# Patient Record
Sex: Male | Born: 1955 | Race: White | Hispanic: No | Marital: Married | Smoking: Former smoker
Health system: Southern US, Community
[De-identification: ages and names within clinical notes are randomized; demographics above are authoritative.]

## PROBLEM LIST (undated history)

## (undated) DIAGNOSIS — J449 Chronic obstructive pulmonary disease, unspecified: Secondary | ICD-10-CM

## (undated) DIAGNOSIS — C3492 Malignant neoplasm of unspecified part of left bronchus or lung: Secondary | ICD-10-CM

## (undated) DIAGNOSIS — E785 Hyperlipidemia, unspecified: Secondary | ICD-10-CM

## (undated) DIAGNOSIS — J42 Unspecified chronic bronchitis: Secondary | ICD-10-CM

## (undated) DIAGNOSIS — K921 Melena: Secondary | ICD-10-CM

## (undated) DIAGNOSIS — M62830 Muscle spasm of back: Secondary | ICD-10-CM

## (undated) DIAGNOSIS — F419 Anxiety disorder, unspecified: Secondary | ICD-10-CM

## (undated) DIAGNOSIS — G894 Chronic pain syndrome: Secondary | ICD-10-CM

## (undated) DIAGNOSIS — M1990 Unspecified osteoarthritis, unspecified site: Secondary | ICD-10-CM

## (undated) DIAGNOSIS — K219 Gastro-esophageal reflux disease without esophagitis: Secondary | ICD-10-CM

## (undated) DIAGNOSIS — I10 Essential (primary) hypertension: Secondary | ICD-10-CM

## (undated) DIAGNOSIS — Z9089 Acquired absence of other organs: Secondary | ICD-10-CM

## (undated) HISTORY — DX: Gastro-esophageal reflux disease without esophagitis: K21.9

## (undated) HISTORY — DX: Muscle spasm of back: M62.830

## (undated) HISTORY — DX: Malignant neoplasm of unspecified part of left bronchus or lung: C34.92

## (undated) HISTORY — PX: TONSILLECTOMY: SUR1361

## (undated) HISTORY — DX: Chronic pain syndrome: G89.4

## (undated) HISTORY — PX: OTHER SURGICAL HISTORY: SHX169

## (undated) HISTORY — DX: Chronic obstructive pulmonary disease, unspecified: J44.9

## (undated) HISTORY — DX: Acquired absence of other organs: Z90.89

## (undated) HISTORY — DX: Melena: K92.1

## (undated) HISTORY — DX: Unspecified osteoarthritis, unspecified site: M19.90

## (undated) HISTORY — DX: Essential (primary) hypertension: I10

## (undated) HISTORY — DX: Unspecified chronic bronchitis: J42

## (undated) HISTORY — DX: Anxiety disorder, unspecified: F41.9

## (undated) HISTORY — DX: Hyperlipidemia, unspecified: E78.5

---

## 1988-08-09 HISTORY — PX: INGUINAL HERNIA REPAIR: SUR1180

## 1990-08-09 HISTORY — PX: BACK SURGERY: SHX140

## 1998-09-15 ENCOUNTER — Other Ambulatory Visit: Admission: RE | Admit: 1998-09-15 | Discharge: 1998-09-15 | Payer: Self-pay | Admitting: Gastroenterology

## 2001-01-20 ENCOUNTER — Encounter: Payer: Self-pay | Admitting: Surgery

## 2001-01-20 ENCOUNTER — Encounter: Admission: RE | Admit: 2001-01-20 | Discharge: 2001-01-20 | Payer: Self-pay | Admitting: Surgery

## 2001-06-29 ENCOUNTER — Encounter: Admission: RE | Admit: 2001-06-29 | Discharge: 2001-06-29 | Payer: Self-pay | Admitting: Surgery

## 2001-06-29 ENCOUNTER — Encounter: Payer: Self-pay | Admitting: Surgery

## 2001-08-21 ENCOUNTER — Encounter: Payer: Self-pay | Admitting: Gastroenterology

## 2003-01-08 ENCOUNTER — Emergency Department (HOSPITAL_COMMUNITY): Admission: EM | Admit: 2003-01-08 | Discharge: 2003-01-08 | Payer: Self-pay

## 2003-01-17 ENCOUNTER — Encounter: Payer: Self-pay | Admitting: Cardiology

## 2003-01-17 ENCOUNTER — Ambulatory Visit (HOSPITAL_COMMUNITY): Admission: RE | Admit: 2003-01-17 | Discharge: 2003-01-17 | Payer: Self-pay | Admitting: Cardiology

## 2004-12-24 ENCOUNTER — Ambulatory Visit: Payer: Self-pay | Admitting: Family Medicine

## 2005-04-06 ENCOUNTER — Ambulatory Visit: Payer: Self-pay | Admitting: Family Medicine

## 2005-04-08 ENCOUNTER — Ambulatory Visit (HOSPITAL_COMMUNITY): Admission: RE | Admit: 2005-04-08 | Discharge: 2005-04-09 | Payer: Self-pay | Admitting: Orthopaedic Surgery

## 2005-06-22 ENCOUNTER — Ambulatory Visit (HOSPITAL_COMMUNITY): Admission: RE | Admit: 2005-06-22 | Discharge: 2005-06-22 | Payer: Self-pay | Admitting: Orthopedic Surgery

## 2005-11-30 ENCOUNTER — Ambulatory Visit: Payer: Self-pay | Admitting: Family Medicine

## 2006-04-27 ENCOUNTER — Ambulatory Visit: Payer: Self-pay | Admitting: Family Medicine

## 2006-07-14 ENCOUNTER — Ambulatory Visit: Payer: Self-pay | Admitting: Family Medicine

## 2006-11-08 ENCOUNTER — Ambulatory Visit: Payer: Self-pay | Admitting: Family Medicine

## 2006-12-06 ENCOUNTER — Ambulatory Visit: Payer: Self-pay | Admitting: Family Medicine

## 2007-01-16 ENCOUNTER — Ambulatory Visit: Payer: Self-pay | Admitting: Pulmonary Disease

## 2007-03-28 ENCOUNTER — Telehealth: Payer: Self-pay | Admitting: Family Medicine

## 2007-03-29 ENCOUNTER — Telehealth: Payer: Self-pay | Admitting: Family Medicine

## 2007-05-02 DIAGNOSIS — K219 Gastro-esophageal reflux disease without esophagitis: Secondary | ICD-10-CM

## 2007-05-02 DIAGNOSIS — J4489 Other specified chronic obstructive pulmonary disease: Secondary | ICD-10-CM | POA: Insufficient documentation

## 2007-05-02 DIAGNOSIS — E785 Hyperlipidemia, unspecified: Secondary | ICD-10-CM | POA: Insufficient documentation

## 2007-05-02 DIAGNOSIS — J449 Chronic obstructive pulmonary disease, unspecified: Secondary | ICD-10-CM

## 2007-05-23 ENCOUNTER — Telehealth: Payer: Self-pay | Admitting: Family Medicine

## 2007-05-24 ENCOUNTER — Telehealth: Payer: Self-pay | Admitting: Family Medicine

## 2007-05-25 ENCOUNTER — Encounter: Payer: Self-pay | Admitting: Family Medicine

## 2007-05-31 ENCOUNTER — Telehealth: Payer: Self-pay | Admitting: Family Medicine

## 2007-06-22 DIAGNOSIS — Z9089 Acquired absence of other organs: Secondary | ICD-10-CM

## 2007-06-23 ENCOUNTER — Telehealth (INDEPENDENT_AMBULATORY_CARE_PROVIDER_SITE_OTHER): Payer: Self-pay | Admitting: *Deleted

## 2007-06-23 ENCOUNTER — Telehealth: Payer: Self-pay

## 2007-06-26 ENCOUNTER — Telehealth: Payer: Self-pay | Admitting: Family Medicine

## 2007-06-26 ENCOUNTER — Telehealth: Payer: Self-pay

## 2007-07-04 ENCOUNTER — Telehealth: Payer: Self-pay | Admitting: Family Medicine

## 2007-08-22 ENCOUNTER — Telehealth: Payer: Self-pay | Admitting: Family Medicine

## 2007-10-05 ENCOUNTER — Telehealth: Payer: Self-pay | Admitting: Family Medicine

## 2007-10-10 ENCOUNTER — Ambulatory Visit: Payer: Self-pay | Admitting: Family Medicine

## 2007-10-10 DIAGNOSIS — G894 Chronic pain syndrome: Secondary | ICD-10-CM | POA: Insufficient documentation

## 2007-11-06 ENCOUNTER — Telehealth: Payer: Self-pay | Admitting: Family Medicine

## 2007-12-05 ENCOUNTER — Telehealth: Payer: Self-pay | Admitting: Family Medicine

## 2008-01-31 ENCOUNTER — Telehealth: Payer: Self-pay | Admitting: Family Medicine

## 2008-02-13 ENCOUNTER — Ambulatory Visit: Payer: Self-pay | Admitting: Family Medicine

## 2008-02-13 DIAGNOSIS — M129 Arthropathy, unspecified: Secondary | ICD-10-CM | POA: Insufficient documentation

## 2008-02-13 DIAGNOSIS — F411 Generalized anxiety disorder: Secondary | ICD-10-CM | POA: Insufficient documentation

## 2008-02-29 ENCOUNTER — Ambulatory Visit: Payer: Self-pay | Admitting: Family Medicine

## 2008-02-29 LAB — CONVERTED CEMR LAB
Ketones, urine, test strip: NEGATIVE
Protein, U semiquant: NEGATIVE
Urobilinogen, UA: 0.2
WBC Urine, dipstick: NEGATIVE

## 2008-03-07 ENCOUNTER — Ambulatory Visit: Payer: Self-pay | Admitting: Family Medicine

## 2008-03-07 DIAGNOSIS — M5380 Other specified dorsopathies, site unspecified: Secondary | ICD-10-CM | POA: Insufficient documentation

## 2008-03-07 LAB — CONVERTED CEMR LAB
ALT: 15 units/L (ref 0–53)
CO2: 29 meq/L (ref 19–32)
Calcium: 9.1 mg/dL (ref 8.4–10.5)
Chloride: 102 meq/L (ref 96–112)
Creatinine, Ser: 0.7 mg/dL (ref 0.4–1.5)
Eosinophils Absolute: 0.3 10*3/uL (ref 0.0–0.7)
Eosinophils Relative: 3.2 % (ref 0.0–5.0)
GFR calc non Af Amer: 125 mL/min
Hemoglobin: 16.4 g/dL (ref 13.0–17.0)
MCHC: 34.7 g/dL (ref 30.0–36.0)
MCV: 92.5 fL (ref 78.0–100.0)
Neutro Abs: 5.4 10*3/uL (ref 1.4–7.7)
Neutrophils Relative %: 58.1 % (ref 43.0–77.0)
PSA: 0.71 ng/mL (ref 0.10–4.00)
Platelets: 298 10*3/uL (ref 150–400)
Potassium: 3.8 meq/L (ref 3.5–5.1)
RBC: 5.1 M/uL (ref 4.22–5.81)
WBC: 9.3 10*3/uL (ref 4.5–10.5)

## 2008-03-11 ENCOUNTER — Telehealth: Payer: Self-pay | Admitting: Family Medicine

## 2008-03-13 ENCOUNTER — Ambulatory Visit: Payer: Self-pay | Admitting: Family Medicine

## 2008-03-13 LAB — CONVERTED CEMR LAB
OCCULT 2: NEGATIVE
OCCULT 3: NEGATIVE

## 2008-03-15 ENCOUNTER — Telehealth: Payer: Self-pay | Admitting: Family Medicine

## 2008-03-26 ENCOUNTER — Encounter: Payer: Self-pay | Admitting: Family Medicine

## 2008-03-28 ENCOUNTER — Telehealth: Payer: Self-pay | Admitting: Family Medicine

## 2008-04-09 ENCOUNTER — Telehealth: Payer: Self-pay | Admitting: Family Medicine

## 2008-04-30 ENCOUNTER — Ambulatory Visit: Payer: Self-pay | Admitting: Gastroenterology

## 2008-05-15 ENCOUNTER — Telehealth: Payer: Self-pay | Admitting: Family Medicine

## 2008-05-28 ENCOUNTER — Ambulatory Visit: Payer: Self-pay | Admitting: Gastroenterology

## 2008-05-28 DIAGNOSIS — Z860100 Personal history of colon polyps, unspecified: Secondary | ICD-10-CM | POA: Insufficient documentation

## 2008-05-28 DIAGNOSIS — K5900 Constipation, unspecified: Secondary | ICD-10-CM | POA: Insufficient documentation

## 2008-06-03 ENCOUNTER — Ambulatory Visit: Payer: Self-pay | Admitting: Gastroenterology

## 2008-06-03 ENCOUNTER — Encounter: Payer: Self-pay | Admitting: Gastroenterology

## 2008-06-04 ENCOUNTER — Encounter: Payer: Self-pay | Admitting: Gastroenterology

## 2008-06-10 ENCOUNTER — Telehealth: Payer: Self-pay | Admitting: Family Medicine

## 2008-06-13 ENCOUNTER — Ambulatory Visit: Payer: Self-pay | Admitting: Family Medicine

## 2008-06-27 LAB — CONVERTED CEMR LAB
ALT: 19 units/L (ref 0–53)
Albumin: 3.2 g/dL — ABNORMAL LOW (ref 3.5–5.2)
HDL: 42 mg/dL (ref >39.0)
LDL Cholesterol: 88 mg/dL (ref 0–99)
Total Bilirubin: 0.5 mg/dL (ref 0.3–1.2)
Total CHOL/HDL Ratio: 3.6
VLDL: 22 mg/dL (ref 0–40)

## 2008-08-05 ENCOUNTER — Telehealth: Payer: Self-pay | Admitting: Family Medicine

## 2008-08-06 ENCOUNTER — Ambulatory Visit: Payer: Self-pay | Admitting: Family Medicine

## 2008-08-06 DIAGNOSIS — M67919 Unspecified disorder of synovium and tendon, unspecified shoulder: Secondary | ICD-10-CM | POA: Insufficient documentation

## 2008-08-06 DIAGNOSIS — M719 Bursopathy, unspecified: Secondary | ICD-10-CM

## 2008-08-06 DIAGNOSIS — F1920 Other psychoactive substance dependence, uncomplicated: Secondary | ICD-10-CM | POA: Insufficient documentation

## 2008-10-01 ENCOUNTER — Telehealth: Payer: Self-pay | Admitting: Family Medicine

## 2008-10-07 ENCOUNTER — Telehealth: Payer: Self-pay | Admitting: Family Medicine

## 2009-01-27 ENCOUNTER — Telehealth: Payer: Self-pay | Admitting: Family Medicine

## 2009-01-28 ENCOUNTER — Emergency Department (HOSPITAL_COMMUNITY): Admission: EM | Admit: 2009-01-28 | Discharge: 2009-01-28 | Payer: Self-pay | Admitting: Emergency Medicine

## 2009-03-07 ENCOUNTER — Telehealth (INDEPENDENT_AMBULATORY_CARE_PROVIDER_SITE_OTHER): Payer: Self-pay | Admitting: *Deleted

## 2009-06-10 ENCOUNTER — Telehealth: Payer: Self-pay | Admitting: Family Medicine

## 2009-06-12 ENCOUNTER — Ambulatory Visit: Payer: Self-pay | Admitting: Family Medicine

## 2009-08-18 ENCOUNTER — Telehealth: Payer: Self-pay | Admitting: Family Medicine

## 2009-08-25 ENCOUNTER — Telehealth: Payer: Self-pay | Admitting: Family Medicine

## 2009-09-02 ENCOUNTER — Encounter: Payer: Self-pay | Admitting: Family Medicine

## 2010-01-27 ENCOUNTER — Telehealth: Payer: Self-pay | Admitting: Family Medicine

## 2010-02-11 ENCOUNTER — Ambulatory Visit: Payer: Self-pay | Admitting: Family Medicine

## 2010-02-11 DIAGNOSIS — F1921 Other psychoactive substance dependence, in remission: Secondary | ICD-10-CM | POA: Insufficient documentation

## 2010-02-11 DIAGNOSIS — D649 Anemia, unspecified: Secondary | ICD-10-CM | POA: Insufficient documentation

## 2010-02-11 DIAGNOSIS — Z87898 Personal history of other specified conditions: Secondary | ICD-10-CM | POA: Insufficient documentation

## 2010-02-17 LAB — CONVERTED CEMR LAB
Albumin: 4.5 g/dL (ref 3.5–5.2)
Alkaline Phosphatase: 68 units/L (ref 39–117)
Basophils Absolute: 0 10*3/uL (ref 0.0–0.1)
Bilirubin, Direct: 0.1 mg/dL (ref 0.0–0.3)
Chloride: 105 meq/L (ref 96–112)
Eosinophils Absolute: 0.2 10*3/uL (ref 0.0–0.7)
Eosinophils Relative: 1.6 % (ref 0.0–5.0)
Lymphs Abs: 2.7 10*3/uL (ref 0.7–4.0)
MCHC: 34.3 g/dL (ref 30.0–36.0)
Neutrophils Relative %: 61.3 % (ref 43.0–77.0)
RDW: 14 % (ref 11.5–14.6)
Sodium: 142 meq/L (ref 135–145)
Total CHOL/HDL Ratio: 4
Triglycerides: 120 mg/dL (ref 0.0–149.0)
VLDL: 24 mg/dL (ref 0.0–40.0)

## 2010-08-18 ENCOUNTER — Telehealth: Payer: Self-pay | Admitting: Family Medicine

## 2010-08-30 ENCOUNTER — Encounter: Payer: Self-pay | Admitting: Internal Medicine

## 2010-09-10 NOTE — Progress Notes (Signed)
Summary: Pt no longer uses [facility]. [facility]  Phone Note Call from Patient Call back at [facility] Phone [phone]   Caller: Patient Summary of Call: Pt is no longer getting meds via [facility]. Pt is wanting [facility] [place] [place] Alprazolam, Symbicort, Combivent.  Pt is ok on meds right now, but will need these called in next.  Initial call taken by: Lucy Antigua,  August 18, 2009 2:50 PM  Follow-up for Phone Call        he is due cpx and needs to get that sch and all meds will be refililled at time of cpx. also needs cpx labs  Follow-up by: Pura Spice, RN,  August 19, 2009 10:03 AM  Additional Follow-up for Phone Call Additional follow up Details #1::        I called pt back to sch cpx and labs, and he says that he has refills remaining, but he just needs them swithched over to [facility] in [place], [place]. Pt says that he was just in to see Dr. Scotty Court a few months ago.  Additional Follow-up by: Lucy Antigua,  August 20, 2009 3:18 PM    Additional Follow-up for Phone Call Additional follow up Details #2::    called [facility] and spoke to [facility] pharmacy tech and she said pt does have refills on meds and last time filled for alprazolam was Jun 21 2009 with 90 day supply. stated [facility] and ask that his meds be transferred.  Follow-up by: Pura Spice, RN,  August 20, 2009 3:52 PM  Additional Follow-up for Phone Call Additional follow up Details #3:: Details for Additional Follow-up Action Taken: I  called pt and told him that he would need to [facility] and have [facility] contact [facility] in order to get meds transferred, and that there were no refills remaining, as noted above.  Additional Follow-up by: Lucy Antigua,  August 21, 2009 2:55 PM

## 2010-09-10 NOTE — Assessment & Plan Note (Signed)
Summary: MED CK / REFILL // RS   Vital Signs:  Patient profile:   55 year old male Height:      72 inches Weight:      217 pounds BMI:     29.54 Temp:     98.6 degrees F Pulse rate:   87 / minute Pulse rhythm:   regular BP sitting:   140 / 90  (left arm)  Vitals Entered By: Pura Spice, RN (February 11, 2010 2:19 PM) CC: refills    History of Present Illness: This 55 year old L. is in for refill of medications and a general physical Continues be treated by a pain clinic with Suboxone for hydrocodone addiction and has been doing her well was unable to get all for the medication Does not use analgesics but continues to need alprazolam for anxiety depression He is employed at [employer]. on [employer] to smoke and uses Symbicort and Combivent, lovastatin for hyperlipidemia : Exam was less than 3 years ago Blood pressure controllled Will get lab studies today  Allergies: 1)  ! Codeine  Past History:  Past Medical History: Last updated: 04/29/2008 Current Problems:  MUSCLE SPASM, BACK (ICD-724.8) PHYSICAL EXAMINATION (ICD-V70.0) BLOOD IN STOOL (ICD-578.1) ANXIETY (ICD-300.00) ARTHRITIS (ICD-716.90) CHRONIC PAIN SYNDROME (ICD-338.4) TONSILLECTOMY, HX OF (ICD-V45.79) BRONCHITIS, CHRONIC (ICD-491.9) HYPERTENSION (ICD-401.9) HYPERLIPIDEMIA (ICD-272.4) GERD (ICD-530.81) COPD (ICD-496)  Past Surgical History: Last updated: 04/29/2008 Tonsillectomy hernia inguinal 1990 back surgery 1992 abdominal surgery to remove adhesions from colon (teenager)  Social History: Last updated: 05/28/2008 Patient currently smokes.  Married has two children Occupation: Radio broadcast assistant Alcohol Use - no Daily Caffeine Use  Risk Factors: Smoking Status: current (04/29/2008) Packs/Day: <1 1/2 (05/02/2007)  Review of Systems      See HPI  The patient denies anorexia, fever, weight loss, weight gain, vision loss, decreased hearing, hoarseness, chest pain, syncope, dyspnea on  exertion, peripheral edema, prolonged cough, headaches, hemoptysis, abdominal pain, melena, hematochezia, severe indigestion/heartburn, hematuria, incontinence, genital sores, muscle weakness, suspicious skin lesions, transient blindness, difficulty walking, depression, unusual weight change, abnormal bleeding, enlarged lymph nodes, angioedema, breast masses, and testicular masses.    Physical Exam  General:  Well-developed,well-nourished,in no acute distress; alert,appropriate and cooperative throughout examination Head:  Normocephalic and atraumatic without obvious abnormalities. No apparent alopecia or balding. Eyes:  No corneal or conjunctival inflammation noted. EOMI. Perrla. Funduscopic exam benign, without hemorrhages, exudates or papilledema. Vision grossly normal. Ears:  External ear exam shows no significant lesions or deformities.  Otoscopic examination reveals clear canals, tympanic membranes are intact bilaterally without bulging, retraction, inflammation or discharge. Hearing is grossly normal bilaterally. Nose:  External nasal examination shows no deformity or inflammation. Nasal mucosa are pink and moist without lesions or exudates. Mouth:  Oral mucosa and oropharynx without lesions or exudates.  Teeth in good repair. Neck:  No deformities, masses, or tenderness noted. Chest Wall:  No deformities, masses, tenderness or gynecomastia noted. Breasts:  No masses or gynecomastia noted Lungs:  bronchial breathing no rales no wheeze Heart:  Normal rate and regular rhythm. S1 and S2 normal without gallop, murmur, click, rub or other extra sounds. Abdomen:  Bowel sounds positive,abdomen soft and non-tender without masses, organomegaly or hernias noted. Rectal:  No external abnormalities noted. Normal sphincter tone. No rectal masses or tenderness. Genitalia:  Testes bilaterally descended without nodularity, tenderness or masses. No scrotal masses or lesions. No penis lesions or urethral  discharge. Prostate:  Prostate gland firm and smooth, no enlargement, nodularity, tenderness, mass, asymmetry or  induration. Msk:  No deformity or scoliosis noted of thoracic or lumbar spine.   Pulses:  R and L carotid,radial,femoral,dorsalis pedis and posterior tibial pulses are full and equal bilaterally Extremities:  No clubbing, cyanosis, edema, or deformity noted with normal full range of motion of all joints.   Neurologic:  No cranial nerve deficits noted. Station and gait are normal. Plantar reflexes are down-going bilaterally. DTRs are symmetrical throughout. Sensory, motor and coordinative functions appear intact. Skin:  Intact without suspicious lesions or rashes Cervical Nodes:  No lymphadenopathy noted Axillary Nodes:  No palpable lymphadenopathy Inguinal Nodes:  No significant adenopathy Psych:  Cognition and judgment appear intact. Alert and cooperative with normal attention span and concentration. No apparent delusions, illusions, hallucinations   Complete Medication List: 1)  Alprazolam 1 Mg Tb24 (Alprazolam) .Marland Kitchen.. 1 po three times a day as needed  no early refills refill on sch 2)  Symbicort 160-4.5 Mcg/act Aero (Budesonide-formoterol fumarate) .... 2 inhalation two times a day 3)  Combivent 103-18 Mcg/act Aero (Ipratropium-albuterol) .... 2 inhalations four  times a day as needed asthma 4)  Pantoprazole Sodium 40 Mg Tbec (Pantoprazole sodium) .Marland Kitchen.. 1 by mouth once daily 5)  Lovastatin 40 Mg Tabs (Lovastatin) .Marland Kitchen.. 1 by mouth at bedtime 6)  Suboxone 2-0.5 Mg Subl (Buprenorphine hcl-naloxone hcl) .... [facility] pain management   dr dowaah 7)  Alprazolam 1 Mg Tabs (Alprazolam) .Marland Kitchen.. 1 tid  Other Orders: TLB-Lipid Panel (80061-LIPID) TLB-Hepatic/Liver Function Pnl (80076-HEPATIC) TLB-CBC Platelet - w/Differential (85025-CBCD) TLB-BMP (Basic Metabolic Panel-BMET) (80048-METABOL) TLB-PSA (Prostate Specific Antigen) (84153-PSA)  Patient Instructions: 1)  continue medications as  prescribed 2)  We'll call results of labs 3)  We'll refill meds her medicines Prescriptions: PANTOPRAZOLE SODIUM 40 MG TBEC (PANTOPRAZOLE SODIUM) 1 by mouth once daily  #90 x 3   Entered and Authorized by:   Judithann Sheen MD   Signed by:   Judithann Sheen MD on 02/11/2010   Method used:   Print then Give to Patient   RxID:   [id number] LOVASTATIN 40 MG TABS (LOVASTATIN) 1 by mouth at bedtime  #90 x 3   Entered and Authorized by:   Judithann Sheen MD   Signed by:   Judithann Sheen MD on 02/11/2010   Method used:   Print then Give to Patient   RxID:   [id number] ALPRAZOLAM 1 MG TABS (ALPRAZOLAM) 1 tid  #90 x 5   Entered and Authorized by:   Judithann Sheen MD   Signed by:   Judithann Sheen MD on 02/11/2010   Method used:   Print then Give to Patient   RxID:   [id number] ALPRAZOLAM 1 MG  TB24 (ALPRAZOLAM) 1 po three times a day as needed  no early refills refill on sch  #270 x 3   Entered and Authorized by:   Judithann Sheen MD   Signed by:   Judithann Sheen MD on 02/11/2010   Method used:   Printed then faxed to ...       [facility] Environmental education officer)       [address] [address]       [place], [place]  [address]       Ph: [phone]       Fax: [phone]   RxID:   [id number] COMBIVENT 103-18 MCG/ACT AERO (IPRATROPIUM-ALBUTEROL) 2 inhalations four  times a day as needed asthma  #5 x 3   Entered and Authorized by:  Judithann Sheen MD   Signed by:   Judithann Sheen MD on 02/11/2010   Method used:   Printed then faxed to ...       [facility] Environmental education officer)       [address] [address]       [place], [place]  [address]       Ph: [phone]       Fax: [phone]   RxID:   [id number] SYMBICORT 160-4.5 MCG/ACT  AERO (BUDESONIDE-FORMOTEROL FUMARATE) 2 inhalation two times a day  #3 x 3   Entered and Authorized by:   Judithann Sheen MD   Signed by:   Judithann Sheen MD on 02/11/2010   Method used:   Printed then faxed  to ...       [facility] Environmental education officer)       [address] [address]       [place], [place]  [address]       Ph: [phone]       Fax: [phone]   RxID:   [id number]

## 2010-09-10 NOTE — Progress Notes (Signed)
Summary: [facility]  [phone]   Phone Note Call from Patient Call back at Home Phone [phone]   Call For: stafford Summary of Call: New mailorder for Rxs is [facility].  Need Combivent, Symbicort, Alprazolam sent to [facility].  Is completely out of the Symbicort & Alprazolam.   For the Symbicort will have to call for authorization to [phone]. Initial call taken by: Rudy Jew, RN,  August 25, 2009 11:39 AM  Follow-up for Phone Call        i can call in symbicort but his insurance must send authorization they need. As far as combivent per [facility] Lenon Curt) a quanity of 3 shipped out to him on Jun 23 2009 and they also stated they sent out a quanity of 270 tablets of his alprazolam on Jun 21 2009 which he would not need this filled til Sep 21 2008 and his new pharmacy needs to request this but for now too early.  Follow-up by: Pura Spice, RN,  August 26, 2009 11:27 AM  Additional Follow-up for Phone Call Additional follow up Details #1::        spoke with pt states he has enoough meds til due date but new mail order and may take 7-10 days. Additional Follow-up by: Pura Spice, RN,  August 28, 2009 1:56 PM    Additional Follow-up for Phone Call Additional follow up Details #2::    per dr Scotty Court ok to rx for alprazolam and combivent and to give 2 sample boxes of symbicort.  pt awaare.  Follow-up by: Pura Spice, RN,  August 28, 2009 1:57 PM  Prescriptions: ALPRAZOLAM 1 MG  TB24 (ALPRAZOLAM) 1 po three times a day as needed  no early refills refill on sch  #270 x 1   Entered by:   Pura Spice, RN   Authorized by:   Judithann Sheen MD   Signed by:   Pura Spice, RN on 08/28/2009   Method used:   Print then Give to Patient   RxID:   [id number] COMBIVENT 103-18 MCG/ACT AERO (IPRATROPIUM-ALBUTEROL) 2 inhalations four  times a day as needed asthma  #3 x 3   Entered by:   Pura Spice, RN   Authorized by:    Judithann Sheen MD   Signed by:   Pura Spice, RN on 08/28/2009   Method used:   Print then Give to Patient   RxID:   [id number] SYMBICORT 160-4.5 MCG/ACT  AERO (BUDESONIDE-FORMOTEROL FUMARATE) 2 inhalation two times a day  #3 x 3   Entered by:   Pura Spice, RN   Authorized by:   Judithann Sheen MD   Signed by:   Pura Spice, RN on 08/28/2009   Method used:   Print then Give to Patient   RxID:   [id number]  above rx were printed then [facility] to [facility]  [phone] and his ID # [phone]

## 2010-09-10 NOTE — Progress Notes (Signed)
Summary: refill on alprazolam  Phone Note From Pharmacy   [facility]  [address] [phone][facility] Reason for Call: Needs renewal Details for Reason: alprazolam 1mg  Initial call taken by: Romualdo Bolk, CMA Duncan Dull),  August 18, 2010 1:08 PM  Follow-up for Phone Call        Per Dr. Scotty Court- Okay x 5 Rx faxed to pharmacy Follow-up by: Romualdo Bolk, CMA (AAMA),  August 18, 2010 1:14 PM    Prescriptions: ALPRAZOLAM 1 MG  TB24 (ALPRAZOLAM) 1 po three times a day as needed  no early refills refill on sch  #90 x 4   Entered by:   Romualdo Bolk, CMA (AAMA)   Authorized by:   Judithann Sheen MD   Signed by:   Romualdo Bolk, CMA (AAMA) on 08/18/2010   Method used:   Handwritten   RxID:   [id number]

## 2010-09-10 NOTE — Medication Information (Signed)
Summary: Coverage Approval for Symbicort/[facility]  Coverage Approval for Symbicort/[facility]   Imported By: Maryln Gottron 09/03/2009 15:49:23  _____________________________________________________________________  External Attachment:    Type:   Image     Comment:   External Document

## 2010-09-10 NOTE — Progress Notes (Signed)
Summary: REFILL REQUEST  Phone Note Refill Request Message from:  Patient on January 27, 2010 2:18 PM  Refills Requested: Medication #1:  ALPRAZOLAM 1 MG  TB24 1 po three times a day as needed  no early refills refill on sch   Notes: [facility].    Initial call taken by: Debbra Riding,  January 27, 2010 2:19 PM  Follow-up for Phone Call        needs office visit.  Follow-up by: Pura Spice, RN,  January 29, 2010 5:09 PM  Additional Follow-up for Phone Call Additional follow up Details #1::        Pt informed Additional Follow-up by: Sid Falcon LPN,  January 29, 2010 5:42 PM

## 2010-12-22 NOTE — Assessment & Plan Note (Signed)
[facility]                             PULMONARY OFFICE NOTE   NAME:JONESLyndle, Pang                       MRN:          [id number]  DATE:01/16/2007                            DOB:          [birth year]    PROBLEM:  Dyspnea.   HISTORY OF PRESENT ILLNESS:  Mr. Pomplun is a 55 year old gentleman who  presented to [facility] at the end of May with increased exertional  dyspnea and cough productive of purulent mucus. He had similar  presentation in mid April. On this most recent presentation, his oxygen  saturation was 89%. He was diagnosed with an exacerbation of COPD and  treated with Depo-Medrol, prednisone taper and antibiotics. He is now  back to his baseline. At the time of his chest x-ray in April, he was  diagnosed with pneumonia based on findings in the right lower lobe. The  repeat chest x-ray at the end of May showed no change and consequently a  CT scan of the chest was performed. The radiologist noted scarring in  the right lower lobe and enlarged pulmonary arteries consistent with  pulmonary hypertension. Consequently, he has been referred to me for  further evaluation. At the present time, he has mild exertional dyspnea,  but is able to walk three flights of stairs or more than one mile on  flat ground. He has daily chronic cough productive of yellow mucus. He  does have some difficulty with his work, employed as an Personnel officer, but  mostly this is fatigue more than shortness of breath. He smokes 2-1/2  packs of cigarettes per day and has smoked for approximately 40 years.  He has made one attempt to quit in the past when Wellbutrin was  prescribed. However, the Wellbutrin did not seem to be of any benefit.  Otherwise, he denies pleuritic and anginal chest pain. He has no  hemoptysis. He denies lower extremity edema and calf tenderness. He has  no nausea, vomiting or diarrhea and dysuria.   PAST MEDICAL HISTORY:  1. Hypertension.  2.  Gastroesophageal reflux disease.  3. Chronic bronchitis.  4. Status post shoulder surgery.  5. Status post tonsillectomy.  6. Hernia repair.  7. He was diagnosed with pneumonia in December of 2007.   CURRENT MEDICATIONS:  1. Norvasc.  2. Atenolol.  3. Protonix.  4. Albuterol p.r.n.   SOCIAL HISTORY:  As above. He smokes 2-1/2 packs of cigarettes per day.  He is married and employed as an Personnel officer. He drinks minimal alcohol.   FAMILY HISTORY:  Both his mother and father died of heart attacks, his  mother at age 53 and his father at age 51. His mother was a smoker. His  father also had been diagnosed with colon cancer. Multiple family  members suffer from allergies.   REVIEW OF SYSTEMS:  Is as per the History of Present Illness and is  otherwise negative except for symptoms of recurrent leg cramps mostly at  night.   PHYSICAL EXAMINATION:  In general, he is well-developed, well-nourished  in no acute cardiac respiratory distress. Temperature 98.8, blood  pressure 122/78,  pulse 81 and regular, respirations are 16 and  unlabored. Room air oxygen saturation is 94%. His weight today is 228  pounds. He smells strongly of cigarettes. He is pleasant and articulate  and seems well informed regarding his medical conditions.  HEENT: Reveals normal tympanic canals and membranes. Nasal mucosa is  pink. Nares are patent. Oropharynx reveals no exudates nor erythema.  NECK: Supple without adenopathy. No jugular venous distention.  CHEST: Examination reveals mildly diminished breath sounds. Percussion  noted as normal. He did have an isolated inspiratory wheeze in the mid  lung zone on the right posteriorly. This cleared almost completely with  deep cough. There were no findings of consolidation.  CARDIAC: Examination reveals a regular rate and rhythm with no murmurs.  ABDOMEN: Is obese, soft and nontender with normal bowel sounds.  EXTREMITIES: Are without clubbing, cyanosis and edema.   NEUROLOGIC: Is without focal deficits.   DATA:  CT of the chest performed on Jan 05, 2007 at [facility]  [facility]iology revealed minimal right lower lobe scarring and mildly enlarged  pulmonary arteries.   IMPRESSION:  1. Chronic obstructive pulmonary disease, chronic bronchitis. I judge      his severity to be mild to moderate based on his history and CT      scan findings. He has had three episodes of exacerbation in the      past 18 months.  2. Question of pulmonary arterial hypertension based on CT scan      findings-I have no doubt that he does have some increased pulmonary      artery pressures most likely due to chronic obstructive pulmonary      disease. In other words, if indeed present, this would be      classified as secondary pulmonary hypertension rather than primary      pulmonary hypertension.  3. Smoker-He is a very heavy smoker. He has good insight into the      health consequences of smoking and expresses a desire to quit. He      has only made one half-hearted attempt at smoking cessation in the      past and found Wellbutrin to be of no benefit. He smokes his first      cigarette within 20 minutes upon awakening. We discussed at length      his smoking triggers. I re-iterated in detail the health      consequences of smoking including the respiratory as well as the      cardiac consequences.   PLAN:  The rest of the encounter revolved around discussion of smoking  cessation. I discussed in detail strategies to deal with the unpleasant  withdrawal symptoms. We discussed possible medical therapies including  nicotine replacement therapy, another trial of Wellbutrin or Chantix. He  wants to try nicotine replacement therapy. Therefore, I have recommended  that he wear a 21 mg nicotine patch and use 2 mg nicotine gum up to 8 or  10 pieces per day. I assured him that this would not be an excessive  amount of nicotine and would be far less than the amount that he is   currently getting. At the present time, we will forego any chronic  medical therapy for his chronic obstructive pulmonary disease given that  his symptoms are rather mild and given that the benefit of smoking  cessation would far outweigh any benefit of medical therapy if he  continues to smoke. He is to followup with me in one month at which  time  we will obtain pulmonary function tests and repeat a chest x-ray to  establish our baseline.     Oley Balm Sung Amabile, MD  Electronically Signed    DBS/MedQ  DD: 01/16/2007  DT: 01/16/2007  Job #: [id number]   cc:   Gabriel Earing, M.D.  Ellin Saba., MD

## 2010-12-25 NOTE — Op Note (Signed)
Alex Hart, Alex Hart                ACCOUNT NO.:  [id number]   MEDICAL RECORD NO.:  [id number]          PATIENT TYPE:  AMB   LOCATION:  DAY                          FACILITY:  [facility]   PHYSICIAN:  Marlowe Kays, M.D.  DATE OF BIRTH:  1955-10-06   DATE OF PROCEDURE:  06/22/2005  DATE OF DISCHARGE:                                 OPERATIVE REPORT   PREOPERATIVE DIAGNOSIS:  1.  Degenerated edematous acromioclavicular joint.  2.  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   POSTOPERATIVE DIAGNOSIS:  1.  Degenerated edematous acromioclavicular joint.  2.  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   OPERATION:  1.  Left shoulder arthroscopy (essentially normal examination).  2.  Arthroscopic subacromial decompression.  3.  Open resection of the distal left clavicle.   SURGEON:  Marlowe Kays, M.D.   ASSISTANTDruscilla Brownie. Idolina Primer, P.A.-C.   ANESTHESIA:  General.   PATHOLOGY AND INDICATIONS FOR PROCEDURE:  Chronic pain, left shoulder with  left shoulder MRI of May 14, 2005, demonstrating the above pathology.  Accordingly, he is here today for the above surgery.   DESCRIPTION OF PROCEDURE:  After satisfactory general anesthesia, beach-  chair position was lying frame, left shoulder was prepped with DuraPrep and  draped in a sterile field.  The anatomy of the shoulder joint was marked out  and proposed incision for the distal clavicle resection subacromial space  lateral and posterior portals infiltrated with 0.5% Marcaine with  adrenaline.  Through a posterior soft spot portal I atraumatically entered  the glenohumeral joint.  Other than some minimal fraying of the distal  biceps tendon and its attachment to the labrum which I did not feel required  any shaving, the interior of the joint looked normal.  Representative  pictures were taken.  I then redirected the scope in the  subacromial area  and through a lateral portal I introduced a blunt trocar.  He did not have a  lot of bursitis type of reaction.  A small amount of clean up was done  including some shaving of the  bursal surface of the rotator cuff, which did  demonstrate some areas of minor tears.  I then used the 90 degree ArthroCare  vaporizer to remove soft tissue from the undersurface of the distal clavicle  followed by a 4 mm oval bur, and I then burred down the undersurface of the  acromion until he had a wide decompression.  I also utilized going back and  forth between the vaporizer and the 42 shaver.  The decompression had been  completed as documented with arm to side arm abducted.  I removed all fluid  possible from the subacromial space and made an open incision over the  distal clavicle with  subperiosteal dissection identifying the [facility] joint.  I  then measured off 1.5 cm from it and marked the clavicle at this point.  I  protected the underneath surface of the clavicle with some baby Bennetts and  used a micro  saw to cut through it, removing the cut fragment with towel  clip and cautery.  Some remaining spicules of bone were apparent in the  clavicle.  I removed it with a small rongeur.  I then covered the raw bone  with bone wax, irrigated it well and packed the incision site with a  Gelfoam.  I then closed the wound with interrupted #1  Vicryl in the fascia, 2-0 Vicryl subcutaneous tissue with Steri-Strips in  the skin and 4-0 nylon in the portals.  Dry sterile dressing and shoulder  mobilizer applied.  He tolerated the procedure well.  At the time of this  dictation, he was on his way to the recovery room in satisfactory condition  with no known complications.           ______________________________  Marlowe Kays, M.D.     JA/MEDQ  D:  06/22/2005  T:  06/22/2005  Job:  [id number]

## 2010-12-25 NOTE — Op Note (Signed)
NAME:  Alex Hart, Alex Hart                ACCOUNT NO.:  [id number]   MEDICAL RECORD NO.:  [id number]          PATIENT TYPE:  OIB   LOCATION:  2856                         FACILITY:  [facility]   PHYSICIAN:  Sharolyn Douglas, M.D.        DATE OF BIRTH:  June 02, 1956   DATE OF PROCEDURE:  04/08/2005  DATE OF DISCHARGE:                                 OPERATIVE REPORT   DIAGNOSIS:  Cervical spondylotic radiculopathy.   PROCEDURE:  1.  Anterior cervical diskectomy C3-4 and C4-5 with decompression of the      spinal cord and nerve roots bilaterally.  2.  Anterior cervical fusion C3-4 and C4-5 with placement of two allograft      prosthesis spacers packed with local autogenous bone graft.  3.  Anterior cervical plating C3-C5 using the Abbott spine system.   SURGEON:  Sharolyn Douglas, MD.   ASSISTANTJunious Dresser __________, PA   ANESTHESIA:  General endotracheal.   COMPLICATIONS:  None.   ESTIMATED BLOOD LOSS:  Minimal.   INDICATIONS:  Patient is a pleasant 55 year old male with chronic left upper  extremity radiculopathy felt to be secondary to cervical spondylosis at C3-4  and C4-5. He has multilevel degenerative changes with mild spinal stenosis  which is felt to be asymptomatic below the levels which were being treated  today. Risk and benefits were reviewed and elected to proceed.   DESCRIPTION OF PROCEDURE:  The patient was identified in the holding area,  taken to the operating room, underwent general endotracheal anesthesia  without difficulty, given prophylactic IV antibiotics. He was carefully  positioned on the operating room table with 5 pounds of halter traction  applied to its neck. The neck was prepped and draped in the usual sterile  fashion. A small transverse incision was made left side level of the thyroid  cartilage. Dissection was carried sharply through the platysma. The interval  between the SCM and strap muscles, medially, was developed down to the  prevertebral space. An  intraoperative x-ray taken to confirm location. The  C5-6 level was easily identifiable by the anterior osteophyte and we worked  up exposing the C3-4 and C4-5 levels by elevating the longus coli muscle.   Deep shadow line retractor was placed. Caspar distraction pins placed in the  C3, C4, C5 vertebral bodies. The microscope was draped and brought into the  field. Diskectomies were completed back to the posterior longitudinal  ligament. The posterior longitudinal ligament was taken down from the  midline out to the left foramen. Wide foraminotomies were completed  bilaterally. Bleeding was controlled using bipolar electrocautery and  FloSeal. We then placed a 9-mm allograft prosthesis spacer which had been  packed with local bone graft obtained from the drill shavings. The graft was  inserted into the C3, 4, 5 interspace and carefully countersunk 2 mm. We  then performed a similar procedure at C4-5, this time, using an 8-mm graft,  again, packed with local bone graft from drill shavings.   At this point an anterior cervical plate 16-XW was applied to the anterior  cervical spine with six  13-mm locking screws. There was good screw purchase.  We ensured that the locking mechanism engaged. The wound was irrigated;  hemostasis was achieved. The esophagus, trachea, carotid sheath were  examined. No apparent injuries. A deep Penrose drain was left in place. The  platysma was closed with interrupted 2-0 Vicryl, subcutaneous layer closed  with interrupted 3-0 Vicryl, followed by a running 4-0 subcuticular Vicryl  suture on the skin edges. Benzoin and Steri-Strips placed. Sterile dressing  applied. Soft cervical collar placed. The patient was extubated without  difficulty, transferred to recovery room in stable condition able to move  his upper and lower extremities.      Sharolyn Douglas, M.D.  Electronically Signed     MC/MEDQ  D:  04/08/2005  T:  04/09/2005  Job:  [id number]

## 2010-12-25 NOTE — Cardiovascular Report (Signed)
NAME:  Alex Hart, Alex Hart                          ACCOUNT NO.:  [id number]   MEDICAL RECORD NO.:  [id number]                   PATIENT TYPE:  OIB   LOCATION:  2857                                 FACILITY:  [facility]   PHYSICIAN:  Salvadore Farber, M.D.             DATE OF BIRTH:  Nov 04, 1955   DATE OF PROCEDURE:  01/17/2003  DATE OF DISCHARGE:                              CARDIAC CATHETERIZATION   PROCEDURE:  Left heart catheterization, left ventriculography, coronary  angiography.   INDICATIONS:  The patient is a 55 year old gentleman with hypertension,  dyslipidemia, positive family history, and tobacco use who presents with  exertional chest discomfort.  Exercise test demonstrated inferior ischemia  at 7 minutes of the standard Bruce protocol.  He is referred for diagnostic  angiography.   PROCEDURAL TECHNIQUE:  Informed consent was obtained.  Under 1% lidocaine  local anesthesia a 6-French sheath was placed in the right femoral artery  using the modified Seldinger technique.  Diagnostic angiography was  performed using JL4 and JR4 catheters.  Pigtail catheter was used to perform  ventriculography in the RAO projection.  The patient tolerated the procedure  well and was transferred to the holding room in stable condition.   COMPLICATIONS:  None.   FINDINGS:  1. LV 136/7/14.  EF 60% without regional wall motion abnormality.  2. No aortic stenosis or mitral regurgitation.  3. Left main:  Angiographically normal.  4. LAD:  The LAD is a large vessel giving rise to a small first and much     larger second diagonal branch.  The LAD itself has minor luminal     irregularities.  There is a 70% ostial stenosis at the small first     diagonal.  5. Circumflex:  The circumflex is a large vessel giving rise to a single     large obtuse marginal.  It is angiographically normal.  6. RCA:  The RCA is a small, diffusely diseased vessel.  The proximal vessel     is totally occluded with robust  bridging collaterals.  The entirety of     the proximal vessel is diffusely and severely diseased.  There is then an     additional 95% stenosis in the mid vessel.  The distal RCA is less than 2     mm vessel.  The distal RCA and PDA are collateralized from the left.   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  Given the diffuse  nature of the disease and the small caliber of the vessel, I favor a plan of  trial of medical therapy.  To that end, I have added Imdur and atenolol to  his Norvasc and aspirin.  Should his symptoms prove refractory to medical  therapy, PCI could be entertained.  Salvadore Farber, M.D.    WED/MEDQ  D:  01/17/2003  T:  01/17/2003  Job:  [id number]   cc:   Vania Rea. Jarold Motto, M.D. [facility]   Pricilla Riffle, M.D.

## 2010-12-25 NOTE — H&P (Signed)
NAME:  Alex Hart, Alex Hart                ACCOUNT NO.:  [id number]   MEDICAL RECORD NO.:  [id number]          PATIENT TYPE:  OIB   LOCATION:                               FACILITY:  [facility]   PHYSICIAN:  Sharolyn Douglas, M.D.        DATE OF BIRTH:  1956-03-13   DATE OF ADMISSION:  04/08/2005  DATE OF DISCHARGE:                                HISTORY & PHYSICAL   ADMISSION DIAGNOSIS:  Neck and left upper extremity pain and weakness.   HISTORY OF PRESENT ILLNESS:  The patient is a 55 year old male who has had  severe pain and weakness in his neck and left upper extremity for  approximately three weeks now.  Apparently, there is no specific injury.  He  saw Dr. Simonne Come and was found to have a severe problem.  Secondary to his  MRI findings, it was thought that he would be best served with surgery to  decompress his cervical nerves.  This surgery specifically would be a C3-4  and 4-5 ACDF.  The risks and benefits of this surgery were discussed with  the patient by Dr. Noel Gerold secondary to his profound weakness and pain.  The  patient would like to proceed as soon as possible.   PAST MEDICAL HISTORY:  1.  Hypertension.  2.  GERD.   PAST SURGICAL HISTORY:  1.  Hernia.  2.  Tonsils and adenoids.  3.  Lumbar surgery.   ALLERGIES:  CODEINE CAUSES SEVERE NAUSEA AND VOMITING.  HE IS FINE WITH  VICODIN.   MEDICATIONS:  1.  Altace 5 mg daily.  2.  Norvasc 10 mg daily.  3.  Protonix 40 mg daily.  4.  Xanax 1 mg on occasion.   REVIEW OF SYSTEMS:  The patient denies any fevers, chills, sweats, or  bleeding tendencies.  CNS:  Denies blurred vision, double vision, seizures,  headache.  CARDIOVASCULAR:  Denies chest pain, angina, orthopnea,  claudication, or palpitations.  PULMONARY:  Denies shortness of breath,  productive cough, hemoptysis.  GI:  Denies nausea, vomiting, constipation,  diarrhea, melena, or bloody stools.  GU:  Denies dysuria, hematuria,  discharge.  MUSCULOSKELETAL:  As per HPI.   PHYSICAL EXAMINATION:  VITAL SIGNS:  Blood pressure 136/78, respirations 16  and unlabored, pulse 86 and regular.  GENERAL:  55 year old white male, alert and oriented, in no acute distress.  Well-nourished, well-groomed, appears stated age.  Pleasant and cooperative  with exam.  NECK:  Soft to palpation.  No lymphadenopathy or thyromegaly noted.  CHEST:  Clear to auscultation bilaterally.  No rales, rhonchi, stridor,  wheeze, or friction rubs.  HEART:  S1, S2.  Regular rate and rhythm.  No murmurs, rubs, or gallops  noted.  ABDOMEN:  Soft to palpation, nontender, nondistended.  No organomegaly  noted.  GU:  Not pertinent, not performed.  EXTREMITIES:  Range of motion is intact.  The patient has left upper  extremity significant weakness.  SKIN:  Intact.  NEUROLOGIC:  Intact.   IMAGING STUDIES:  X-rays and MRI showed degenerative disk disease and  foraminal narrowing at C3-4  and 4-5.   IMPRESSION:  1.  Cervical spondylytic radiculopathy.  2.  Hypertension.  3.  Gastroesophageal reflux disease.  4.  Tobacco abuse.   PLAN:  1.  Admit to [facility] on April 09, 2005 for ACDF C3-4 and 4-5.      This will be done by Dr. Sharolyn Douglas.  2.  I did discuss the importance of quitting smoking prior to this surgery      with the patient.  He states that he is trying to cut down as much as      possible.      Verlin Fester, P.A.      Sharolyn Douglas, M.D.  Electronically Signed    CM/MEDQ  D:  04/05/2005  T:  04/05/2005  Job:  [id number]

## 2011-01-06 ENCOUNTER — Encounter: Payer: Self-pay | Admitting: Family Medicine

## 2011-01-06 ENCOUNTER — Ambulatory Visit (INDEPENDENT_AMBULATORY_CARE_PROVIDER_SITE_OTHER): Payer: BC Managed Care – PPO | Admitting: Family Medicine

## 2011-01-06 VITALS — BP 132/82 | HR 93 | Temp 98.3°F | Wt 228.5 lb

## 2011-01-06 DIAGNOSIS — Z0000 Encounter for general adult medical examination without abnormal findings: Secondary | ICD-10-CM

## 2011-01-06 DIAGNOSIS — E785 Hyperlipidemia, unspecified: Secondary | ICD-10-CM

## 2011-01-06 DIAGNOSIS — R351 Nocturia: Secondary | ICD-10-CM

## 2011-01-06 DIAGNOSIS — G894 Chronic pain syndrome: Secondary | ICD-10-CM

## 2011-01-06 DIAGNOSIS — E039 Hypothyroidism, unspecified: Secondary | ICD-10-CM

## 2011-01-06 DIAGNOSIS — I10 Essential (primary) hypertension: Secondary | ICD-10-CM

## 2011-01-06 LAB — CBC WITH DIFFERENTIAL/PLATELET
Basophils Absolute: 0 10*3/uL (ref 0.0–0.1)
Basophils Relative: 0.5 % (ref 0.0–3.0)
Eosinophils Absolute: 0.2 10*3/uL (ref 0.0–0.7)
Eosinophils Relative: 2.2 % (ref 0.0–5.0)
HCT: 47.1 % (ref 39.0–52.0)
Hemoglobin: 16.1 g/dL (ref 13.0–17.0)
Lymphs Abs: 2.4 10*3/uL (ref 0.7–4.0)
MCV: 95.7 fl (ref 78.0–100.0)
Monocytes Absolute: 0.7 10*3/uL (ref 0.1–1.0)
Monocytes Relative: 7 % (ref 3.0–12.0)
RBC: 4.92 Mil/uL (ref 4.22–5.81)
RDW: 14 % (ref 11.5–14.6)
WBC: 9.6 10*3/uL (ref 4.5–10.5)

## 2011-01-06 LAB — POCT URINALYSIS DIPSTICK
Glucose, UA: NEGATIVE
Nitrite, UA: NEGATIVE
Spec Grav, UA: 1.02
Urobilinogen, UA: 0.2

## 2011-01-06 LAB — LIPID PANEL
Cholesterol: 187 mg/dL (ref 0–200)
HDL: 54.8 mg/dL (ref >39.00)
Total CHOL/HDL Ratio: 3

## 2011-01-06 LAB — HEPATIC FUNCTION PANEL
ALT: 18 U/L (ref 0–53)
AST: 14 U/L (ref 0–37)
Alkaline Phosphatase: 71 U/L (ref 39–117)
Bilirubin, Direct: 0.1 mg/dL (ref 0.0–0.3)
Total Protein: 7 g/dL (ref 6.0–8.3)

## 2011-01-06 LAB — BASIC METABOLIC PANEL
CO2: 30 mEq/L (ref 19–32)
Chloride: 100 mEq/L (ref 96–112)
Glucose, Bld: 103 mg/dL — ABNORMAL HIGH (ref 70–99)
Potassium: 4.9 mEq/L (ref 3.5–5.1)
Sodium: 137 mEq/L (ref 135–145)

## 2011-01-06 MED ORDER — DICLOFENAC SODIUM 75 MG PO TBEC: 75.0000 mg | DELAYED_RELEASE_TABLET | Freq: Two times a day (BID) | ORAL | Status: DC

## 2011-01-06 MED ORDER — IPRATROPIUM-ALBUTEROL 18-103 MCG/ACT IN AERO: 2.0000 | INHALATION_SPRAY | Freq: Four times a day (QID) | RESPIRATORY_TRACT | Status: DC | PRN

## 2011-01-06 MED ORDER — VARENICLINE TARTRATE 0.5 MG PO TABS: 0.5000 mg | ORAL_TABLET | Freq: Two times a day (BID) | ORAL | Status: DC

## 2011-01-06 MED ORDER — VARENICLINE TARTRATE 1 MG PO TABS: 1.0000 mg | ORAL_TABLET | Freq: Two times a day (BID) | ORAL | Status: DC

## 2011-01-06 MED ORDER — ALPRAZOLAM 1 MG PO TABS: 1.0000 mg | ORAL_TABLET | Freq: Three times a day (TID) | ORAL | Status: DC | PRN

## 2011-01-07 LAB — VITAMIN D 25 HYDROXY (VIT D DEFICIENCY, FRACTURES): Vit D, 25-Hydroxy: 29 ng/mL — ABNORMAL LOW (ref 30–89)

## 2011-01-14 ENCOUNTER — Telehealth: Payer: Self-pay | Admitting: Family Medicine

## 2011-01-17 ENCOUNTER — Encounter: Payer: Self-pay | Admitting: Family Medicine

## 2011-01-17 NOTE — Progress Notes (Signed)
  Subjective:    Patient ID: Alex Hart, male    DOB: [birth year], 55 y.o.   MRN: [id number] C28-71-year-old white male male was in for routine examination and laboratory studies a current smoker who is rated to quit and request Chantix. He also complained of pain in his neck he has a history of chronic back pain and has had laminectomy. He has had an addiction or abuse of oxycodone for which he goes to the pain clinic and receiving Suboxone and has done so for the last 2-3 years Has a history of COPD, anxiety arthritis he has no other complaints at this time HPI    Review of Systemssee history of present illness     Objective:   Physical Exam the patient is a well-developed well-nourished white male who appears to be in no distress HEENT reveals tenderness over the cervical spine carotid pulses are good thyroid nonpalpable no other finding Lung examination reveals decreased breath sounds occasional rales with expiratory wheezes pO2 93% Heart no cardiomegaly heart sounds are good without normal regular rhythm Abdomen the liver spleen kidneys nonpalpable no masses no tenderness bowel sounds normal Genitalia normal Rectal examination reveals prostate to be slightly enlarged nontender no nodules Spine examination is stable bowel reveals a tenderness cervical spine of C4-7 also evident laminectomy scar Neurological exam upon C5 Skin negative       Assessment & Plan:  COPD agree with smoking cessation and use Chantix as instructed Hyperlipidemia continue Lovastatin Arthritis and continued diclofenac Chronic pain syndrome continue treatment at pain clinic on Suboxone Chronic anxiety continue alprazolam as prescribed

## 2011-01-19 ENCOUNTER — Other Ambulatory Visit: Payer: Self-pay

## 2011-01-19 MED ORDER — ALPRAZOLAM 1 MG PO TABS: 1.0000 mg | ORAL_TABLET | Freq: Three times a day (TID) | ORAL | Status: DC | PRN

## 2011-01-19 MED ORDER — VARENICLINE TARTRATE 1 MG PO TABS: ORAL_TABLET | ORAL | Status: DC

## 2011-01-19 NOTE — Telephone Encounter (Signed)
rx sent to [facility] and rx for alprazolam faxed to pharmacy.

## 2011-01-22 ENCOUNTER — Other Ambulatory Visit: Payer: Self-pay

## 2011-01-22 ENCOUNTER — Telehealth: Payer: Self-pay

## 2011-01-22 MED ORDER — ALPRAZOLAM 1 MG PO TABS: 1.0000 mg | ORAL_TABLET | Freq: Three times a day (TID) | ORAL | Status: DC | PRN

## 2011-01-22 MED ORDER — VITAMIN D (ERGOCALCIFEROL) 1.25 MG (50000 UNIT) PO CAPS: 50000.0000 [IU] | ORAL_CAPSULE | ORAL | Status: DC

## 2011-01-22 NOTE — Telephone Encounter (Signed)
Message copied by Azucena Freed on Fri Jan 22, 2011  3:06 PM ------      Message from: Harvie Heck.      Created: Thu Jan 07, 2011 11:16 AM       Labs are good  exzcept vit d low, vit d 50000 units weekly for 12 weeks

## 2011-01-22 NOTE — Telephone Encounter (Signed)
[facility] 1[facility] .  [facility] and spoke with Crystal who gave me that doctor's line #: [phone]. Pt is aware that rx has been sent to the local pharmacy [facility].

## 2011-01-22 NOTE — Progress Notes (Signed)
Quick Note:  Pt is aware. ______ 

## 2011-01-26 ENCOUNTER — Other Ambulatory Visit: Payer: Self-pay

## 2011-01-26 MED ORDER — VITAMIN D (ERGOCALCIFEROL) 1.25 MG (50000 UNIT) PO CAPS: 50000.0000 [IU] | ORAL_CAPSULE | ORAL | Status: DC

## 2011-01-26 NOTE — Telephone Encounter (Signed)
Pt called and stated his rx for vitamin d had not been sent to pharmacy; [facility]--rx will be sent to local pharmacy.

## 2011-01-28 ENCOUNTER — Other Ambulatory Visit: Payer: Self-pay | Admitting: Family Medicine

## 2011-01-28 ENCOUNTER — Telehealth: Payer: Self-pay

## 2011-01-28 MED ORDER — VARENICLINE TARTRATE 1 MG PO TABS: 1.0000 mg | ORAL_TABLET | Freq: Two times a day (BID) | ORAL | Status: DC

## 2011-01-28 MED ORDER — VARENICLINE TARTRATE 0.5 MG PO TABS: ORAL_TABLET | ORAL | Status: AC

## 2011-01-28 NOTE — Telephone Encounter (Signed)
Pt called and stated he has received his chantix starter kit but pt has been taking 0.5 mg of chantix for about 2 to 3 weeks.  He is still in the smoking stages.  Pt would like to know what to take since he has been on the 0.5.  Please advise.

## 2011-02-01 NOTE — Telephone Encounter (Signed)
Per Dr. Scotty Court pt is take chantix 1 mg bid for 2 months.

## 2011-02-03 NOTE — Telephone Encounter (Signed)
Pt is aware.  

## 2011-03-10 ENCOUNTER — Other Ambulatory Visit: Payer: Self-pay | Admitting: Family Medicine

## 2011-03-31 ENCOUNTER — Other Ambulatory Visit: Payer: Self-pay

## 2011-03-31 MED ORDER — ALPRAZOLAM 1 MG PO TABS: 1.0000 mg | ORAL_TABLET | Freq: Three times a day (TID) | ORAL | Status: DC | PRN

## 2011-03-31 NOTE — Telephone Encounter (Signed)
Ok per Dr.Stafford to give 90 day supply plus 1 additional refill.

## 2011-04-02 NOTE — Telephone Encounter (Signed)
Chart opened in error

## 2011-04-27 ENCOUNTER — Telehealth: Payer: Self-pay | Admitting: *Deleted

## 2011-04-27 NOTE — Telephone Encounter (Signed)
Pt. Is asking for Chantix 1 mg. #90 refills to be sent to [facility].  He has been out for 3 weeks.

## 2011-04-28 NOTE — Telephone Encounter (Signed)
Call in a 90 day supply of Chantix continuing month packs

## 2011-04-29 MED ORDER — VARENICLINE TARTRATE 1 MG PO TABS: 1.0000 mg | ORAL_TABLET | Freq: Two times a day (BID) | ORAL | Status: AC

## 2011-04-29 NOTE — Telephone Encounter (Signed)
Script sent e-scribe to [facility].

## 2011-05-19 ENCOUNTER — Other Ambulatory Visit: Payer: Self-pay

## 2011-05-19 MED ORDER — BUDESONIDE-FORMOTEROL FUMARATE 160-4.5 MCG/ACT IN AERO: 2.0000 | INHALATION_SPRAY | Freq: Two times a day (BID) | RESPIRATORY_TRACT | Status: DC

## 2011-05-19 NOTE — Telephone Encounter (Signed)
rx faxed to [facility] 

## 2011-07-05 ENCOUNTER — Other Ambulatory Visit: Payer: Self-pay | Admitting: Family Medicine

## 2011-07-05 NOTE — Telephone Encounter (Signed)
Pt is going to be est with Dr Posey Rea, but is going to need a 30 day supply of ALPRAZolam (XANAX) 1 MG tablet called in to [facility] in [place], [place] asap.

## 2011-07-06 ENCOUNTER — Telehealth: Payer: Self-pay | Admitting: Family Medicine

## 2011-07-06 MED ORDER — ALPRAZOLAM 1 MG PO TABS: 1.0000 mg | ORAL_TABLET | Freq: Three times a day (TID) | ORAL | Status: DC | PRN

## 2011-07-06 NOTE — Telephone Encounter (Signed)
rx sent into pharmacy

## 2011-07-06 NOTE — Telephone Encounter (Signed)
Pt is going out of town tomorrow

## 2011-07-06 NOTE — Telephone Encounter (Signed)
[facility]in [place], [place]. Pt is going out of town. Thanks.

## 2011-07-06 NOTE — Telephone Encounter (Signed)
rx called into pharmacy

## 2011-07-06 NOTE — Telephone Encounter (Signed)
Call in #90 with no rf 

## 2011-07-06 NOTE — Telephone Encounter (Signed)
Pt last seen 01/06/11. Pls advise.

## 2011-08-16 ENCOUNTER — Encounter: Payer: Self-pay | Admitting: Internal Medicine

## 2011-08-16 ENCOUNTER — Ambulatory Visit (INDEPENDENT_AMBULATORY_CARE_PROVIDER_SITE_OTHER): Payer: BC Managed Care – PPO | Admitting: Internal Medicine

## 2011-08-16 DIAGNOSIS — F1920 Other psychoactive substance dependence, uncomplicated: Secondary | ICD-10-CM

## 2011-08-16 DIAGNOSIS — I10 Essential (primary) hypertension: Secondary | ICD-10-CM | POA: Insufficient documentation

## 2011-08-16 DIAGNOSIS — F411 Generalized anxiety disorder: Secondary | ICD-10-CM

## 2011-08-16 DIAGNOSIS — E559 Vitamin D deficiency, unspecified: Secondary | ICD-10-CM | POA: Insufficient documentation

## 2011-08-16 DIAGNOSIS — J449 Chronic obstructive pulmonary disease, unspecified: Secondary | ICD-10-CM

## 2011-08-16 DIAGNOSIS — K219 Gastro-esophageal reflux disease without esophagitis: Secondary | ICD-10-CM

## 2011-08-16 MED ORDER — ALPRAZOLAM 1 MG PO TABS: 1.0000 mg | ORAL_TABLET | Freq: Three times a day (TID) | ORAL | Status: DC | PRN

## 2011-08-16 MED ORDER — VITAMIN D 1000 UNITS PO TABS: 1000.0000 [IU] | ORAL_TABLET | Freq: Every day | ORAL | Status: DC

## 2011-08-16 MED ORDER — LOVASTATIN 40 MG PO TABS: 40.0000 mg | ORAL_TABLET | Freq: Every day | ORAL | Status: DC

## 2011-08-16 MED ORDER — PANTOPRAZOLE SODIUM 40 MG PO TBEC: 40.0000 mg | DELAYED_RELEASE_TABLET | Freq: Every day | ORAL | Status: DC

## 2011-08-16 MED ORDER — BUDESONIDE-FORMOTEROL FUMARATE 160-4.5 MCG/ACT IN AERO: 2.0000 | INHALATION_SPRAY | Freq: Two times a day (BID) | RESPIRATORY_TRACT | Status: DC

## 2011-08-16 MED ORDER — AMLODIPINE BESYLATE 5 MG PO TABS: 5.0000 mg | ORAL_TABLET | Freq: Every day | ORAL | Status: DC

## 2011-08-16 NOTE — Assessment & Plan Note (Signed)
Continue with current prescription therapy as reflected on the Med list.  

## 2011-08-16 NOTE — Assessment & Plan Note (Signed)
H/o opiate addiction 2010 - on Bupre-nalox

## 2011-08-16 NOTE — Assessment & Plan Note (Signed)
Start Rx 

## 2011-08-16 NOTE — Assessment & Plan Note (Signed)
We will re-start Norvasc he was on before

## 2011-08-16 NOTE — Assessment & Plan Note (Signed)
Continue with current prescription therapy as reflected on the Med list. Smoking discussed  

## 2011-08-16 NOTE — Progress Notes (Signed)
  Subjective:    Patient ID: Alex Hart, male    DOB: 1956/05/22, 56 y.o.   MRN: [id number]  HPI  The patient presents for a follow-up of  chronic hypertension, chronic dyslipidemia, anxiety, COPD, HTN controlled with medicines    Review of Systems  Constitutional: Negative for fever, appetite change, fatigue and unexpected weight change.  HENT: Negative for nosebleeds, congestion, sore throat, sneezing, trouble swallowing and neck pain.   Eyes: Negative for itching and visual disturbance.  Respiratory: Positive for cough.   Cardiovascular: Negative for chest pain, palpitations and leg swelling.  Gastrointestinal: Negative for nausea, diarrhea, blood in stool and abdominal distention.  Genitourinary: Negative for frequency and hematuria.  Musculoskeletal: Negative for back pain, joint swelling and gait problem.  Skin: Negative for rash.  Neurological: Negative for dizziness, tremors, speech difficulty and weakness.  Psychiatric/Behavioral: Negative for sleep disturbance, dysphoric mood and agitation. The patient is nervous/anxious.        Objective:   Physical Exam  Constitutional: He is oriented to person, place, and time. He appears well-developed.  HENT:  Mouth/Throat: Oropharynx is clear and moist.  Eyes: Conjunctivae are normal. Pupils are equal, round, and reactive to light.  Neck: Normal range of motion. No JVD present. No thyromegaly present.  Cardiovascular: Normal rate, regular rhythm, normal heart sounds and intact distal pulses.  Exam reveals no gallop and no friction rub.   No murmur heard. Pulmonary/Chest: Effort normal and breath sounds normal. No respiratory distress. He has no wheezes. He has no rales. He exhibits no tenderness.  Abdominal: Soft. Bowel sounds are normal. He exhibits no distension and no mass. There is no tenderness. There is no rebound and no guarding.  Musculoskeletal: Normal range of motion. He exhibits no edema and no tenderness.    Lymphadenopathy:    He has no cervical adenopathy.  Neurological: He is alert and oriented to person, place, and time. He has normal reflexes. No cranial nerve deficit. He exhibits normal muscle tone. Coordination normal.  Skin: Skin is warm and dry. No rash noted.  Psychiatric: He has a normal mood and affect. His behavior is normal. Judgment and thought content normal.    Labs reviewed     Assessment & Plan:

## 2011-08-18 ENCOUNTER — Encounter: Payer: Self-pay | Admitting: Gastroenterology

## 2011-08-19 ENCOUNTER — Other Ambulatory Visit: Payer: Self-pay

## 2011-08-19 MED ORDER — IPRATROPIUM-ALBUTEROL 18-103 MCG/ACT IN AERO: 2.0000 | INHALATION_SPRAY | Freq: Four times a day (QID) | RESPIRATORY_TRACT | Status: DC | PRN

## 2011-08-19 NOTE — Telephone Encounter (Signed)
Pt called requesting refill of inhaler. Pt says that Rx cannot be transferred due to insurance change.

## 2011-09-30 ENCOUNTER — Encounter: Payer: Self-pay | Admitting: Internal Medicine

## 2011-09-30 ENCOUNTER — Ambulatory Visit (INDEPENDENT_AMBULATORY_CARE_PROVIDER_SITE_OTHER): Payer: BC Managed Care – PPO | Admitting: Internal Medicine

## 2011-09-30 DIAGNOSIS — F17200 Nicotine dependence, unspecified, uncomplicated: Secondary | ICD-10-CM

## 2011-09-30 DIAGNOSIS — I10 Essential (primary) hypertension: Secondary | ICD-10-CM

## 2011-09-30 DIAGNOSIS — J449 Chronic obstructive pulmonary disease, unspecified: Secondary | ICD-10-CM

## 2011-09-30 DIAGNOSIS — F411 Generalized anxiety disorder: Secondary | ICD-10-CM

## 2011-09-30 MED ORDER — VARENICLINE TARTRATE 0.5 MG X 11 & 1 MG X 42 PO MISC: ORAL | Status: DC

## 2011-09-30 MED ORDER — ALPRAZOLAM 1 MG PO TABS: 1.0000 mg | ORAL_TABLET | Freq: Three times a day (TID) | ORAL | Status: DC | PRN

## 2011-09-30 MED ORDER — AMLODIPINE-OLMESARTAN 5-40 MG PO TABS: 1.0000 | ORAL_TABLET | Freq: Every day | ORAL | Status: DC

## 2011-09-30 MED ORDER — VARENICLINE TARTRATE 1 MG PO TABS: 1.0000 mg | ORAL_TABLET | Freq: Two times a day (BID) | ORAL | Status: DC

## 2011-09-30 NOTE — Assessment & Plan Note (Signed)
Continue with current prescription therapy as reflected on the Med list.  

## 2011-09-30 NOTE — Assessment & Plan Note (Signed)
Not well controlled Start Azor 5/40 qd

## 2011-09-30 NOTE — Progress Notes (Signed)
Patient ID: Alex Hart, male   DOB: May 01, 1956, 56 y.o.   MRN: [id number]  Subjective:    Patient ID: Alex Hart, male    DOB: 1955/12/24, 56 y.o.   MRN: [id number]  HPI  The patient presents for a follow-up of  chronic hypertension, chronic dyslipidemia, anxiety, COPD, HTN not controlled with medicines Stress w/sister - sick w/COPD    Review of Systems  Constitutional: Negative for fever, appetite change, fatigue and unexpected weight change.  HENT: Negative for nosebleeds, congestion, sore throat, sneezing, trouble swallowing and neck pain.   Eyes: Negative for itching and visual disturbance.  Respiratory: Positive for cough.   Cardiovascular: Negative for chest pain, palpitations and leg swelling.  Gastrointestinal: Negative for nausea, diarrhea, blood in stool and abdominal distention.  Genitourinary: Negative for frequency and hematuria.  Musculoskeletal: Negative for back pain, joint swelling and gait problem.  Skin: Negative for rash.  Neurological: Negative for dizziness, tremors, speech difficulty and weakness.  Psychiatric/Behavioral: Negative for sleep disturbance, dysphoric mood and agitation. The patient is nervous/anxious.        Objective:   Physical Exam  Constitutional: He is oriented to person, place, and time. He appears well-developed.  HENT:  Mouth/Throat: Oropharynx is clear and moist.  Eyes: Conjunctivae are normal. Pupils are equal, round, and reactive to light.  Neck: Normal range of motion. No JVD present. No thyromegaly present.  Cardiovascular: Normal rate, regular rhythm, normal heart sounds and intact distal pulses.  Exam reveals no gallop and no friction rub.   No murmur heard. Pulmonary/Chest: Effort normal and breath sounds normal. No respiratory distress. He has no wheezes. He has no rales. He exhibits no tenderness.  Abdominal: Soft. Bowel sounds are normal. He exhibits no distension and no mass. There is no tenderness. There is no rebound  and no guarding.  Musculoskeletal: Normal range of motion. He exhibits no edema and no tenderness.  Lymphadenopathy:    He has no cervical adenopathy.  Neurological: He is alert and oriented to person, place, and time. He has normal reflexes. No cranial nerve deficit. He exhibits normal muscle tone. Coordination normal.  Skin: Skin is warm and dry. No rash noted.  Psychiatric: He has a normal mood and affect. His behavior is normal. Judgment and thought content normal.        Assessment & Plan:

## 2011-09-30 NOTE — Assessment & Plan Note (Signed)
Asking for a 3 mo supply - will do

## 2011-09-30 NOTE — Assessment & Plan Note (Signed)
Try chantix 

## 2011-12-30 ENCOUNTER — Ambulatory Visit (INDEPENDENT_AMBULATORY_CARE_PROVIDER_SITE_OTHER): Payer: BC Managed Care – PPO | Admitting: Internal Medicine

## 2011-12-30 ENCOUNTER — Encounter: Payer: Self-pay | Admitting: Internal Medicine

## 2011-12-30 ENCOUNTER — Other Ambulatory Visit: Payer: Self-pay | Admitting: *Deleted

## 2011-12-30 ENCOUNTER — Telehealth: Payer: Self-pay | Admitting: *Deleted

## 2011-12-30 VITALS — BP 170/90 | HR 88 | Temp 98.4°F | Resp 16 | Wt 228.0 lb

## 2011-12-30 DIAGNOSIS — J449 Chronic obstructive pulmonary disease, unspecified: Secondary | ICD-10-CM

## 2011-12-30 DIAGNOSIS — F17200 Nicotine dependence, unspecified, uncomplicated: Secondary | ICD-10-CM

## 2011-12-30 DIAGNOSIS — E785 Hyperlipidemia, unspecified: Secondary | ICD-10-CM

## 2011-12-30 DIAGNOSIS — K219 Gastro-esophageal reflux disease without esophagitis: Secondary | ICD-10-CM

## 2011-12-30 DIAGNOSIS — F411 Generalized anxiety disorder: Secondary | ICD-10-CM

## 2011-12-30 DIAGNOSIS — E559 Vitamin D deficiency, unspecified: Secondary | ICD-10-CM

## 2011-12-30 DIAGNOSIS — I10 Essential (primary) hypertension: Secondary | ICD-10-CM

## 2011-12-30 MED ORDER — TRIAMTERENE-HCTZ 37.5-25 MG PO TABS: 1.0000 | ORAL_TABLET | Freq: Every day | ORAL | Status: DC

## 2011-12-30 MED ORDER — PANTOPRAZOLE SODIUM 40 MG PO TBEC: 40.0000 mg | DELAYED_RELEASE_TABLET | Freq: Every day | ORAL | Status: DC

## 2011-12-30 MED ORDER — ALPRAZOLAM 1 MG PO TABS: 1.0000 mg | ORAL_TABLET | Freq: Three times a day (TID) | ORAL | Status: DC | PRN

## 2011-12-30 MED ORDER — IPRATROPIUM-ALBUTEROL 20-100 MCG/ACT IN AERS: 1.0000 | INHALATION_SPRAY | Freq: Four times a day (QID) | RESPIRATORY_TRACT | Status: DC

## 2011-12-30 NOTE — Assessment & Plan Note (Signed)
Continue with current prescription therapy as reflected on the Med list.  

## 2011-12-30 NOTE — Assessment & Plan Note (Signed)
Refractory  He reduced smoking from 2.5 ppd to 5 sigs a day in 2 mo (5/13)

## 2011-12-30 NOTE — Telephone Encounter (Signed)
Rec fax stating Combivent inhaler is no longer available. Ok to send new Rx for Combivent Respimat 20/100 mcg 4 g. 1 puff q 6 hr prn per Dr. Posey Rea.

## 2011-12-30 NOTE — Progress Notes (Signed)
Patient ID: Alex Hart, male   DOB: [birth year], 56 y.o.   MRN: [id number] Patient ID: Alex Hart, male   DOB: 03-22-1956, 56 y.o.   MRN: [id number]  Subjective:    Patient ID: Alex Hart, male    DOB: Apr 08, 1956, 56 y.o.   MRN: [id number]  HPI  The patient presents for a follow-up of  chronic hypertension, chronic dyslipidemia, anxiety, COPD, HTN not controlled with medicines Stress w/sister - died last month w/COPD  BP Readings from Last 3 Encounters:  12/30/11 170/90  09/30/11 160/90  08/16/11 170/80   Wt Readings from Last 3 Encounters:  12/30/11 228 lb (103.42 kg)  09/30/11 225 lb (102.059 kg)  08/16/11 238 lb (107.956 kg)      Review of Systems  Constitutional: Negative for fever, appetite change, fatigue and unexpected weight change.  HENT: Negative for nosebleeds, congestion, sore throat, sneezing, trouble swallowing and neck pain.   Eyes: Negative for itching and visual disturbance.  Respiratory: Positive for cough.   Cardiovascular: Negative for chest pain, palpitations and leg swelling.  Gastrointestinal: Negative for nausea, diarrhea, blood in stool and abdominal distention.  Genitourinary: Negative for frequency and hematuria.  Musculoskeletal: Negative for back pain, joint swelling and gait problem.  Skin: Negative for rash.  Neurological: Negative for dizziness, tremors, speech difficulty and weakness.  Psychiatric/Behavioral: Negative for sleep disturbance, dysphoric mood and agitation. The patient is nervous/anxious.        Objective:   Physical Exam  Constitutional: He is oriented to person, place, and time. He appears well-developed.  HENT:  Mouth/Throat: Oropharynx is clear and moist.  Eyes: Conjunctivae are normal. Pupils are equal, round, and reactive to light.  Neck: Normal range of motion. No JVD present. No thyromegaly present.  Cardiovascular: Normal rate, regular rhythm, normal heart sounds and intact distal pulses.  Exam reveals no  gallop and no friction rub.   No murmur heard. Pulmonary/Chest: Effort normal and breath sounds normal. No respiratory distress. He has no wheezes. He has no rales. He exhibits no tenderness.  Abdominal: Soft. Bowel sounds are normal. He exhibits no distension and no mass. There is no tenderness. There is no rebound and no guarding.  Musculoskeletal: Normal range of motion. He exhibits no edema and no tenderness.  Lymphadenopathy:    He has no cervical adenopathy.  Neurological: He is alert and oriented to person, place, and time. He has normal reflexes. No cranial nerve deficit. He exhibits normal muscle tone. Coordination normal.  Skin: Skin is warm and dry. No rash noted.  Psychiatric: He has a normal mood and affect. His behavior is normal. Judgment and thought content normal.   Lab Results  Component Value Date   WBC 9.6 01/06/2011   HGB 16.1 01/06/2011   HCT 47.1 01/06/2011   PLT 294.0 01/06/2011   GLUCOSE 103* 01/06/2011   CHOL 187 01/06/2011   TRIG 52.0 01/06/2011   HDL 54.80 01/06/2011   LDLDIRECT 149.0 02/11/2010   LDLCALC 122* 01/06/2011   ALT 18 01/06/2011   AST 14 01/06/2011   NA 137 01/06/2011   K 4.9 01/06/2011   CL 100 01/06/2011   CREATININE 0.5 01/06/2011   BUN 13 01/06/2011   CO2 30 01/06/2011   TSH 0.92 01/06/2011   PSA 1.13 01/06/2011        Assessment & Plan:

## 2011-12-30 NOTE — Assessment & Plan Note (Addendum)
High BP today Start Maxzide if BP is elevated at home

## 2012-02-21 ENCOUNTER — Other Ambulatory Visit: Payer: Self-pay | Admitting: *Deleted

## 2012-02-24 ENCOUNTER — Other Ambulatory Visit: Payer: Self-pay | Admitting: Internal Medicine

## 2012-03-13 ENCOUNTER — Other Ambulatory Visit: Payer: Self-pay | Admitting: *Deleted

## 2012-03-13 MED ORDER — IPRATROPIUM-ALBUTEROL 20-100 MCG/ACT IN AERS: 1.0000 | INHALATION_SPRAY | Freq: Four times a day (QID) | RESPIRATORY_TRACT | Status: DC

## 2012-03-20 ENCOUNTER — Other Ambulatory Visit: Payer: Self-pay | Admitting: *Deleted

## 2012-03-20 MED ORDER — IPRATROPIUM-ALBUTEROL 20-100 MCG/ACT IN AERS: 1.0000 | INHALATION_SPRAY | Freq: Four times a day (QID) | RESPIRATORY_TRACT | Status: DC

## 2012-03-20 NOTE — Telephone Encounter (Signed)
R'cd fax from [facility] for refill of Combivent Respimat Inhaler for 90 day supply.

## 2012-03-31 ENCOUNTER — Ambulatory Visit (INDEPENDENT_AMBULATORY_CARE_PROVIDER_SITE_OTHER): Payer: BC Managed Care – PPO | Admitting: Internal Medicine

## 2012-03-31 ENCOUNTER — Encounter: Payer: Self-pay | Admitting: Internal Medicine

## 2012-03-31 VITALS — BP 158/92 | HR 80 | Temp 98.5°F | Resp 16 | Wt 228.0 lb

## 2012-03-31 DIAGNOSIS — K219 Gastro-esophageal reflux disease without esophagitis: Secondary | ICD-10-CM

## 2012-03-31 DIAGNOSIS — J4489 Other specified chronic obstructive pulmonary disease: Secondary | ICD-10-CM

## 2012-03-31 DIAGNOSIS — E785 Hyperlipidemia, unspecified: Secondary | ICD-10-CM

## 2012-03-31 DIAGNOSIS — J449 Chronic obstructive pulmonary disease, unspecified: Secondary | ICD-10-CM

## 2012-03-31 DIAGNOSIS — F411 Generalized anxiety disorder: Secondary | ICD-10-CM

## 2012-03-31 DIAGNOSIS — I10 Essential (primary) hypertension: Secondary | ICD-10-CM

## 2012-03-31 MED ORDER — ASPIRIN 325 MG PO TABS: 325.0000 mg | ORAL_TABLET | Freq: Every day | ORAL | Status: AC

## 2012-03-31 MED ORDER — ALPRAZOLAM 1 MG PO TABS: 1.0000 mg | ORAL_TABLET | Freq: Three times a day (TID) | ORAL | Status: DC | PRN

## 2012-03-31 MED ORDER — PRAVASTATIN SODIUM 20 MG PO TABS: 20.0000 mg | ORAL_TABLET | Freq: Every day | ORAL | Status: DC

## 2012-03-31 NOTE — Progress Notes (Signed)
   Subjective:    Patient ID: Alex Hart, male    DOB: 01/26/1956, 56 y.o.   MRN: [id number]  HPI  The patient presents for a follow-up of  chronic hypertension, chronic dyslipidemia, anxiety, COPD, HTN not controlled with medicines. He states BP is nl at home - he did not take Maxzide. He is trying to quit smoking Stress w/sister - died recentely w/COPD  BP Readings from Last 3 Encounters:  03/31/12 158/92  12/30/11 170/90  09/30/11 160/90   Wt Readings from Last 3 Encounters:  03/31/12 228 lb (103.42 kg)  12/30/11 228 lb (103.42 kg)  09/30/11 225 lb (102.059 kg)      Review of Systems  Constitutional: Negative for fever, appetite change, fatigue and unexpected weight change.  HENT: Negative for nosebleeds, congestion, sore throat, sneezing, trouble swallowing and neck pain.   Eyes: Negative for itching and visual disturbance.  Respiratory: Positive for cough.   Cardiovascular: Negative for chest pain, palpitations and leg swelling.  Gastrointestinal: Negative for nausea, diarrhea, blood in stool and abdominal distention.  Genitourinary: Negative for frequency and hematuria.  Musculoskeletal: Negative for back pain, joint swelling and gait problem.  Skin: Negative for rash.  Neurological: Negative for dizziness, tremors, speech difficulty and weakness.  Psychiatric/Behavioral: Negative for disturbed wake/sleep cycle, dysphoric mood and agitation. The patient is nervous/anxious.        Objective:   Physical Exam  Constitutional: He is oriented to person, place, and time. He appears well-developed.  HENT:  Mouth/Throat: Oropharynx is clear and moist.  Eyes: Conjunctivae are normal. Pupils are equal, round, and reactive to light.  Neck: Normal range of motion. No JVD present. No thyromegaly present.  Cardiovascular: Normal rate, regular rhythm, normal heart sounds and intact distal pulses.  Exam reveals no gallop and no friction rub.   No murmur heard. Pulmonary/Chest:  Effort normal and breath sounds normal. No respiratory distress. He has no wheezes. He has no rales. He exhibits no tenderness.  Abdominal: Soft. Bowel sounds are normal. He exhibits no distension and no mass. There is no tenderness. There is no rebound and no guarding.  Musculoskeletal: Normal range of motion. He exhibits no edema and no tenderness.  Lymphadenopathy:    He has no cervical adenopathy.  Neurological: He is alert and oriented to person, place, and time. He has normal reflexes. No cranial nerve deficit. He exhibits normal muscle tone. Coordination normal.  Skin: Skin is warm and dry. No rash noted.  Psychiatric: He has a normal mood and affect. His behavior is normal. Judgment and thought content normal.   Lab Results  Component Value Date   WBC 9.6 01/06/2011   HGB 16.1 01/06/2011   HCT 47.1 01/06/2011   PLT 294.0 01/06/2011   GLUCOSE 103* 01/06/2011   CHOL 187 01/06/2011   TRIG 52.0 01/06/2011   HDL 54.80 01/06/2011   LDLDIRECT 149.0 02/11/2010   LDLCALC 122* 01/06/2011   ALT 18 01/06/2011   AST 14 01/06/2011   NA 137 01/06/2011   K 4.9 01/06/2011   CL 100 01/06/2011   CREATININE 0.5 01/06/2011   BUN 13 01/06/2011   CO2 30 01/06/2011   TSH 0.92 01/06/2011   PSA 1.13 01/06/2011        Assessment & Plan:

## 2012-03-31 NOTE — Assessment & Plan Note (Signed)
Doing better -- 5 cig/d

## 2012-03-31 NOTE — Assessment & Plan Note (Signed)
Continue with current prescription therapy as reflected on the Med list.  

## 2012-03-31 NOTE — Assessment & Plan Note (Signed)
Try Pravastatin low dose qod Labs in 3 mo Start ASA

## 2012-03-31 NOTE — Assessment & Plan Note (Signed)
protonix qd   

## 2012-05-01 ENCOUNTER — Encounter: Payer: Self-pay | Admitting: Gastroenterology

## 2012-05-08 ENCOUNTER — Other Ambulatory Visit: Payer: Self-pay

## 2012-05-08 MED ORDER — IPRATROPIUM-ALBUTEROL 20-100 MCG/ACT IN AERS: 1.0000 | INHALATION_SPRAY | Freq: Four times a day (QID) | RESPIRATORY_TRACT | Status: DC

## 2012-06-16 ENCOUNTER — Other Ambulatory Visit (INDEPENDENT_AMBULATORY_CARE_PROVIDER_SITE_OTHER): Payer: BC Managed Care – PPO

## 2012-06-16 ENCOUNTER — Encounter: Payer: Self-pay | Admitting: Internal Medicine

## 2012-06-16 ENCOUNTER — Ambulatory Visit (INDEPENDENT_AMBULATORY_CARE_PROVIDER_SITE_OTHER): Payer: BC Managed Care – PPO | Admitting: Internal Medicine

## 2012-06-16 VITALS — BP 148/82 | HR 76 | Temp 97.5°F | Resp 16 | Wt 226.0 lb

## 2012-06-16 DIAGNOSIS — E785 Hyperlipidemia, unspecified: Secondary | ICD-10-CM

## 2012-06-16 DIAGNOSIS — I10 Essential (primary) hypertension: Secondary | ICD-10-CM

## 2012-06-16 DIAGNOSIS — J449 Chronic obstructive pulmonary disease, unspecified: Secondary | ICD-10-CM

## 2012-06-16 DIAGNOSIS — F411 Generalized anxiety disorder: Secondary | ICD-10-CM

## 2012-06-16 DIAGNOSIS — F17200 Nicotine dependence, unspecified, uncomplicated: Secondary | ICD-10-CM

## 2012-06-16 DIAGNOSIS — M129 Arthropathy, unspecified: Secondary | ICD-10-CM

## 2012-06-16 DIAGNOSIS — K219 Gastro-esophageal reflux disease without esophagitis: Secondary | ICD-10-CM

## 2012-06-16 LAB — LIPID PANEL
HDL: 40.4 mg/dL (ref >39.00)
Triglycerides: 107 mg/dL (ref 0.0–149.0)
VLDL: 21.4 mg/dL (ref 0.0–40.0)

## 2012-06-16 LAB — BASIC METABOLIC PANEL
BUN: 19 mg/dL (ref 6–23)
Calcium: 9.3 mg/dL (ref 8.4–10.5)
Creatinine, Ser: 0.7 mg/dL (ref 0.4–1.5)
GFR: 115.29 mL/min (ref >60.00)
Glucose, Bld: 120 mg/dL — ABNORMAL HIGH (ref 70–99)
Sodium: 139 mEq/L (ref 135–145)

## 2012-06-16 MED ORDER — ALPRAZOLAM 1 MG PO TABS: 1.0000 mg | ORAL_TABLET | Freq: Three times a day (TID) | ORAL | Status: DC | PRN

## 2012-06-16 MED ORDER — BUDESONIDE-FORMOTEROL FUMARATE 160-4.5 MCG/ACT IN AERO: 2.0000 | INHALATION_SPRAY | Freq: Two times a day (BID) | RESPIRATORY_TRACT | Status: DC

## 2012-06-16 MED ORDER — AMLODIPINE-OLMESARTAN 10-40 MG PO TABS: 1.0000 | ORAL_TABLET | Freq: Every day | ORAL | Status: DC

## 2012-06-16 MED ORDER — VARENICLINE TARTRATE 0.5 MG X 11 & 1 MG X 42 PO MISC: ORAL | Status: DC

## 2012-06-16 MED ORDER — VARENICLINE TARTRATE 1 MG PO TABS: 1.0000 mg | ORAL_TABLET | Freq: Two times a day (BID) | ORAL | Status: DC

## 2012-06-16 NOTE — Progress Notes (Signed)
   Subjective:    Patient ID: Alex Hart, male    DOB: May 14, 1956, 56 y.o.   MRN: [id number]  HPI  The patient presents for a follow-up of  chronic hypertension, chronic dyslipidemia, anxiety, COPD, HTN not controlled with medicines. He states BP is nl at home - he did not take Maxzide. He is trying to quit smoking  Stress w/sister - died fairly recentely w/COPD  BP Readings from Last 3 Encounters:  06/16/12 148/82  03/31/12 158/92  12/30/11 170/90   Wt Readings from Last 3 Encounters:  06/16/12 226 lb (102.513 kg)  03/31/12 228 lb (103.42 kg)  12/30/11 228 lb (103.42 kg)      Review of Systems  Constitutional: Negative for fever, appetite change, fatigue and unexpected weight change.  HENT: Negative for nosebleeds, congestion, sore throat, sneezing, trouble swallowing and neck pain.   Eyes: Negative for itching and visual disturbance.  Respiratory: Positive for cough.   Cardiovascular: Negative for chest pain, palpitations and leg swelling.  Gastrointestinal: Negative for nausea, diarrhea, blood in stool and abdominal distention.  Genitourinary: Negative for frequency and hematuria.  Musculoskeletal: Negative for back pain, joint swelling and gait problem.  Skin: Negative for rash.  Neurological: Negative for dizziness, tremors, speech difficulty and weakness.  Psychiatric/Behavioral: Negative for sleep disturbance, dysphoric mood and agitation. The patient is nervous/anxious.        Objective:   Physical Exam  Constitutional: He is oriented to person, place, and time. He appears well-developed.  HENT:  Mouth/Throat: Oropharynx is clear and moist.  Eyes: Conjunctivae normal are normal. Pupils are equal, round, and reactive to light.  Neck: Normal range of motion. No JVD present. No thyromegaly present.  Cardiovascular: Normal rate, regular rhythm, normal heart sounds and intact distal pulses.  Exam reveals no gallop and no friction rub.   No murmur  heard. Pulmonary/Chest: Effort normal and breath sounds normal. No respiratory distress. He has no wheezes. He has no rales. He exhibits no tenderness.  Abdominal: Soft. Bowel sounds are normal. He exhibits no distension and no mass. There is no tenderness. There is no rebound and no guarding.  Musculoskeletal: Normal range of motion. He exhibits no edema and no tenderness.  Lymphadenopathy:    He has no cervical adenopathy.  Neurological: He is alert and oriented to person, place, and time. He has normal reflexes. No cranial nerve deficit. He exhibits normal muscle tone. Coordination normal.  Skin: Skin is warm and dry. No rash noted.  Psychiatric: He has a normal mood and affect. His behavior is normal. Judgment and thought content normal.   Lab Results  Component Value Date   WBC 9.6 01/06/2011   HGB 16.1 01/06/2011   HCT 47.1 01/06/2011   PLT 294.0 01/06/2011   GLUCOSE 103* 01/06/2011   CHOL 187 01/06/2011   TRIG 52.0 01/06/2011   HDL 54.80 01/06/2011   LDLDIRECT 149.0 02/11/2010   LDLCALC 122* 01/06/2011   ALT 18 01/06/2011   AST 14 01/06/2011   NA 137 01/06/2011   K 4.9 01/06/2011   CL 100 01/06/2011   CREATININE 0.5 01/06/2011   BUN 13 01/06/2011   CO2 30 01/06/2011   TSH 0.92 01/06/2011   PSA 1.13 01/06/2011        Assessment & Plan:

## 2012-06-16 NOTE — Assessment & Plan Note (Signed)
Prn OTC meds 

## 2012-06-16 NOTE — Assessment & Plan Note (Signed)
Continue with current prescription therapy as reflected on the Med list.  

## 2012-06-16 NOTE — Assessment & Plan Note (Signed)
Better Azor dose was increased

## 2012-06-16 NOTE — Assessment & Plan Note (Signed)
Will try chantix - see rx

## 2012-06-18 ENCOUNTER — Encounter: Payer: Self-pay | Admitting: Internal Medicine

## 2012-10-17 ENCOUNTER — Encounter: Payer: Self-pay | Admitting: Internal Medicine

## 2012-10-17 ENCOUNTER — Ambulatory Visit (INDEPENDENT_AMBULATORY_CARE_PROVIDER_SITE_OTHER): Payer: BC Managed Care – PPO | Admitting: Internal Medicine

## 2012-10-17 VITALS — BP 150/88 | HR 84 | Temp 98.5°F | Resp 16 | Wt 233.0 lb

## 2012-10-17 DIAGNOSIS — E785 Hyperlipidemia, unspecified: Secondary | ICD-10-CM

## 2012-10-17 DIAGNOSIS — F411 Generalized anxiety disorder: Secondary | ICD-10-CM

## 2012-10-17 DIAGNOSIS — J4489 Other specified chronic obstructive pulmonary disease: Secondary | ICD-10-CM

## 2012-10-17 DIAGNOSIS — J449 Chronic obstructive pulmonary disease, unspecified: Secondary | ICD-10-CM

## 2012-10-17 DIAGNOSIS — K219 Gastro-esophageal reflux disease without esophagitis: Secondary | ICD-10-CM

## 2012-10-17 DIAGNOSIS — F17200 Nicotine dependence, unspecified, uncomplicated: Secondary | ICD-10-CM

## 2012-10-17 MED ORDER — BUDESONIDE-FORMOTEROL FUMARATE 160-4.5 MCG/ACT IN AERO: 2.0000 | INHALATION_SPRAY | Freq: Two times a day (BID) | RESPIRATORY_TRACT | Status: DC

## 2012-10-17 MED ORDER — IPRATROPIUM-ALBUTEROL 20-100 MCG/ACT IN AERS: 1.0000 | INHALATION_SPRAY | Freq: Four times a day (QID) | RESPIRATORY_TRACT | Status: DC

## 2012-10-17 MED ORDER — PANTOPRAZOLE SODIUM 40 MG PO TBEC: 40.0000 mg | DELAYED_RELEASE_TABLET | Freq: Every day | ORAL | Status: DC

## 2012-10-17 MED ORDER — TRIAMTERENE-HCTZ 37.5-25 MG PO TABS: 1.0000 | ORAL_TABLET | Freq: Every day | ORAL | Status: DC

## 2012-10-17 MED ORDER — ALPRAZOLAM 1 MG PO TABS: 1.0000 mg | ORAL_TABLET | Freq: Three times a day (TID) | ORAL | Status: DC | PRN

## 2012-10-17 NOTE — Assessment & Plan Note (Signed)
Pulm consult Continue with current prescription therapy as reflected on the Med list. Smoking discused

## 2012-10-17 NOTE — Assessment & Plan Note (Signed)
He had to d/c Pravachol

## 2012-10-17 NOTE — Assessment & Plan Note (Signed)
Continue with current prescription therapy as reflected on the Med list.  

## 2012-10-17 NOTE — Progress Notes (Signed)
   Subjective:     HPI  The patient presents for a follow-up of  chronic hypertension, chronic dyslipidemia - he stopped Pravachol - achy, anxiety, COPD, HTN not controlled - he stopped Azor due to dizziness. Not taking Maxzide for ?reason. He states BP is nl at home - he did not take Maxzide. He is trying to quit smoking - no luck. C/o cough and SOB.  Stress w/sister - died w/COPD  BP Readings from Last 3 Encounters:  10/17/12 150/88  06/16/12 148/82  03/31/12 158/92   Wt Readings from Last 3 Encounters:  10/17/12 233 lb (105.688 kg)  06/16/12 226 lb (102.513 kg)  03/31/12 228 lb (103.42 kg)      Review of Systems  Constitutional: Negative for fever, appetite change, fatigue and unexpected weight change.  HENT: Negative for nosebleeds, congestion, sore throat, sneezing, trouble swallowing and neck pain.   Eyes: Negative for itching and visual disturbance.  Respiratory: Positive for cough.   Cardiovascular: Negative for chest pain, palpitations and leg swelling.  Gastrointestinal: Negative for nausea, diarrhea, blood in stool and abdominal distention.  Genitourinary: Negative for frequency and hematuria.  Musculoskeletal: Negative for back pain, joint swelling and gait problem.  Skin: Negative for rash.  Neurological: Negative for dizziness, tremors, speech difficulty and weakness.  Psychiatric/Behavioral: Negative for sleep disturbance, dysphoric mood and agitation. The patient is nervous/anxious.        Objective:   Physical Exam  Constitutional: He is oriented to person, place, and time. He appears well-developed.  HENT:  Mouth/Throat: Oropharynx is clear and moist.  Eyes: Conjunctivae are normal. Pupils are equal, round, and reactive to light.  Neck: Normal range of motion. No JVD present. No thyromegaly present.  Cardiovascular: Normal rate, regular rhythm, normal heart sounds and intact distal pulses.  Exam reveals no gallop and no friction rub.   No murmur  heard. Pulmonary/Chest: Effort normal and breath sounds normal. No respiratory distress. He has no wheezes. He has no rales. He exhibits no tenderness.  Abdominal: Soft. Bowel sounds are normal. He exhibits no distension and no mass. There is no tenderness. There is no rebound and no guarding.  Musculoskeletal: Normal range of motion. He exhibits no edema and no tenderness.  Lymphadenopathy:    He has no cervical adenopathy.  Neurological: He is alert and oriented to person, place, and time. He has normal reflexes. No cranial nerve deficit. He exhibits normal muscle tone. Coordination normal.  Skin: Skin is warm and dry. No rash noted.  Psychiatric: He has a normal mood and affect. His behavior is normal. Judgment and thought content normal.   Lab Results  Component Value Date   WBC 9.6 01/06/2011   HGB 16.1 01/06/2011   HCT 47.1 01/06/2011   PLT 294.0 01/06/2011   GLUCOSE 120* 06/16/2012   CHOL 177 06/16/2012   TRIG 107.0 06/16/2012   HDL 40.40 06/16/2012   LDLDIRECT 149.0 02/11/2010   LDLCALC 115* 06/16/2012   ALT 18 01/06/2011   AST 14 01/06/2011   NA 139 06/16/2012   K 5.4* 06/16/2012   CL 102 06/16/2012   CREATININE 0.7 06/16/2012   BUN 19 06/16/2012   CO2 30 06/16/2012   TSH 0.92 01/06/2011   PSA 1.13 01/06/2011        Assessment & Plan:

## 2012-10-17 NOTE — Assessment & Plan Note (Signed)
Continue with current OTC therapy as reflected on the Med list.  

## 2012-10-17 NOTE — Assessment & Plan Note (Signed)
Discussed.

## 2012-10-23 ENCOUNTER — Telehealth: Payer: Self-pay | Admitting: Internal Medicine

## 2012-10-23 NOTE — Telephone Encounter (Signed)
Patient Information:  Caller Name: [name]  Phone: [phone]  Patient: Alex Hart, Hart  Gender: Male  DOB: Oct 18, 1955  Age: 57 Years  PCP: Plotnikov, Alex Hart (Adults only)  Office Follow Up:  Does the office need to follow up with this patient?: Yes  Instructions For The Office: Please review and contact patient at  cell  [phone].   Symptoms  Reason For Call & Symptoms: Has asthma and February was a bad month.  Got 90 day supply of Combivent on  2/5.  Has been taking Rx much more often than one puff Q6Hrs, sometimes frequently - each treatment does help and he is quickly back to normal..  Now down to last canister and about 12-15 puffs.  Pharmacy will not fill Rx until May.  Currently sounds clear, asking if another similar medication can be called in to use until May when he can refill his Combivent. Has Appt with pulmonologist on 3/24.   Reviewed Health History In EMR: Yes  Reviewed Medications In EMR: Yes  Reviewed Allergies In EMR: Yes  Reviewed Surgeries / Procedures: Yes  Date of Onset of Symptoms: 09/09/2012  Treatments Tried: Ordered Combivent one puff Q6Hrs but taking a lot more.  Treatments Tried Worked: Yes  Guideline(s) Used:  Asthma Attack  Disposition Per Guideline:   See Today in Office  Reason For Disposition Reached:   Asthma medicine (nebulizer or inhaler) is needed more frequently than every 4 hours to keep you comfortable  Advice Given:  N/A  Patient Refused Recommendation:  Patient Requests Prescription  Wanting similar Rx to Combivent called to [facility] [place].

## 2012-10-24 ENCOUNTER — Telehealth: Payer: Self-pay | Admitting: Internal Medicine

## 2012-10-24 MED ORDER — ALBUTEROL SULFATE HFA 108 (90 BASE) MCG/ACT IN AERS: 2.0000 | INHALATION_SPRAY | Freq: Four times a day (QID) | RESPIRATORY_TRACT | Status: DC | PRN

## 2012-10-24 NOTE — Telephone Encounter (Signed)
Caller: Alex Hart/Patient; Phone: [phone]; Reason for Call: Patient states he spoke with nurse yesterday 10/23/12 concerning his combivent inhaler.  Pt reports he is in the red with his last canister and it is to early for refill (insurance will not pay and out of pocket $300.  00).  Pt is requesting provider to call in another Rx rescue inhaler to get him through the next 30 40 days.  [facility] [address]  [place], [place].  PLEASE F/U WITH PT, THANK YOU.

## 2012-10-24 NOTE — Telephone Encounter (Signed)
Ok Ventolin prn Thx

## 2012-10-25 NOTE — Telephone Encounter (Signed)
Pt advised via personal home VM 

## 2012-10-30 ENCOUNTER — Ambulatory Visit (INDEPENDENT_AMBULATORY_CARE_PROVIDER_SITE_OTHER)
Admission: RE | Admit: 2012-10-30 | Discharge: 2012-10-30 | Disposition: A | Discharge status: Home / Self Care | Payer: BC Managed Care – PPO | Source: Ambulatory Visit | Attending: Internal Medicine | Admitting: Internal Medicine

## 2012-10-30 ENCOUNTER — Encounter: Payer: Self-pay | Admitting: Internal Medicine

## 2012-10-30 ENCOUNTER — Ambulatory Visit (INDEPENDENT_AMBULATORY_CARE_PROVIDER_SITE_OTHER): Payer: BC Managed Care – PPO | Admitting: Internal Medicine

## 2012-10-30 VITALS — BP 142/80 | HR 83 | Temp 98.5°F | Ht 72.0 in | Wt 228.4 lb

## 2012-10-30 DIAGNOSIS — F17200 Nicotine dependence, unspecified, uncomplicated: Secondary | ICD-10-CM

## 2012-10-30 DIAGNOSIS — J449 Chronic obstructive pulmonary disease, unspecified: Secondary | ICD-10-CM

## 2012-10-30 NOTE — Patient Instructions (Addendum)
Plan A = automatic = symbicort 160 Take 2 puffs first thing in am and then another 2 puffs about 12 hours later.   Only use your albuterol as a rescue medication to be used if you can't catch your breath by resting or doing a relaxed purse lip breathing pattern. The less you use it, the better it will work when you need it.   Please remember to go to  The x-ray department downstairs for your tests - we will call you with the results when they are available.  Please schedule a follow up office visit in 4 weeks, sooner if needed with pfts

## 2012-10-30 NOTE — Progress Notes (Signed)
  Subjective:    Patient ID: KEAHI MCCARNEY, male    DOB: 10/06/1955   MRN: [id number]  HPI  18 yowm smoker with doe x 2009 worse 2013 referred 10/30/2012 to pulmonary clinic by Dr Posey Rea   10/30/2012 1st pulmonary eval still smoking  With doe x pushing a mower, rushing when walking dog, otherwise has just learned to pace himself.  No obvious daytime variabilty or assoc chronic cough or cp or chest tightness, subjective wheeze overt sinus or hb symptoms. No unusual exp hx or h/o childhood pna/ asthma or premature birth to his knowledge.  Sleeping ok without nocturnal  or early am exacerbation  of respiratory  c/o's or need for noct saba. Also denies any obvious fluctuation of symptoms with weather or environmental changes or other aggravating or alleviating factors except as outlined above   ROS  The following are not active complaints unless bolded sore throat, dysphagia, dental problems, itching, sneezing,  nasal congestion or excess/ purulent secretions, ear ache,   fever, chills, sweats, unintended wt loss, classically  pleuritic or exertional cp, hemoptysis,  orthopnea pnd or leg swelling, presyncope, palpitations, heartburn, abdominal pain, anorexia, nausea, vomiting, diarrhea  or change in bowel or urinary habits, change in stools or urine, dysuria,hematuria,  rash, arthralgias, visual complaints, headache, numbness weakness or ataxia or problems with walking or coordination,  change in mood/affect or memory.        Review of Systems  HENT: Positive for congestion and postnasal drip.   Respiratory: Positive for cough and shortness of breath.   Cardiovascular: Positive for chest pain.  Musculoskeletal: Positive for arthralgias.       Objective:   Physical Exam  Wt Readings from Last 3 Encounters:  10/30/12 228 lb 6.4 oz (103.602 kg)  10/17/12 233 lb (105.688 kg)  06/16/12 226 lb (102.513 kg)    HEENT mild turbinate edema.  Oropharynx no thrush or excess pnd or cobblestoning.   No JVD or cervical adenopathy. Mild accessory muscle hypertrophy. Trachea midline, nl thryroid. Chest was hyperinflated by percussion with diminished breath sounds and moderate increased exp time without wheeze. Hoover sign positive at mid inspiration. Regular rate and rhythm without murmur gallop or rub or increase P2 or edema.  Abd: no hsm, nl excursion. Ext warm without cyanosis or clubbing.    CXR  10/30/2012 :   New pulmonary vascular congestion superimposed on extensive emphysema.       Assessment & Plan:

## 2012-10-31 NOTE — Assessment & Plan Note (Signed)
DDX of  difficult airways managment all start with A and  include Adherence, Ace Inhibitors, Acid Reflux, Active Sinus Disease, Alpha 1 Antitripsin deficiency, Anxiety masquerading as Airways dz,  ABPA,  allergy(esp in young), Aspiration (esp in elderly), Adverse effects of DPI,  Active smokers, plus two Bs  = Bronchiectasis and Beta blocker use..and one C= CHF  The proper method of use, as well as anticipated side effects, of a metered-dose inhaler are discussed and demonstrated to the patient. Improved effectiveness after extensive coaching during this visit to a level of approximately  75% so rx with symbicort 160 2bid pending pft   Active smoking > discussed separatly  ? chf > suggested by cxr so needs bnp / tsh / cba/ renal function rechecked

## 2012-10-31 NOTE — Assessment & Plan Note (Signed)

## 2012-11-02 ENCOUNTER — Other Ambulatory Visit: Payer: Self-pay | Admitting: Internal Medicine

## 2012-11-02 DIAGNOSIS — R0600 Dyspnea, unspecified: Secondary | ICD-10-CM

## 2012-11-02 NOTE — Progress Notes (Signed)
Quick Note:  Spoke with pt and notified of results per Dr. Sherene Sires. Pt verbalized understanding and denied any questions. Pt will come in for labs 3/28 Orders placed ______

## 2012-11-03 ENCOUNTER — Other Ambulatory Visit (INDEPENDENT_AMBULATORY_CARE_PROVIDER_SITE_OTHER): Payer: BC Managed Care – PPO

## 2012-11-03 DIAGNOSIS — R0989 Other specified symptoms and signs involving the circulatory and respiratory systems: Secondary | ICD-10-CM

## 2012-11-03 DIAGNOSIS — R0600 Dyspnea, unspecified: Secondary | ICD-10-CM

## 2012-11-03 LAB — BASIC METABOLIC PANEL
BUN: 13 mg/dL (ref 6–23)
Calcium: 9.1 mg/dL (ref 8.4–10.5)
GFR: 143.89 mL/min (ref >60.00)
Glucose, Bld: 120 mg/dL — ABNORMAL HIGH (ref 70–99)

## 2012-11-03 LAB — CBC WITH DIFFERENTIAL/PLATELET
Basophils Absolute: 0 10*3/uL (ref 0.0–0.1)
Eosinophils Absolute: 0.2 10*3/uL (ref 0.0–0.7)
HCT: 49.2 % (ref 39.0–52.0)
Hemoglobin: 16.3 g/dL (ref 13.0–17.0)
Lymphocytes Relative: 23.3 % (ref 12.0–46.0)
Lymphs Abs: 2.3 10*3/uL (ref 0.7–4.0)
MCHC: 33 g/dL (ref 30.0–36.0)
Neutro Abs: 6.7 10*3/uL (ref 1.4–7.7)
RDW: 14.3 % (ref 11.5–14.6)

## 2012-11-06 ENCOUNTER — Telehealth: Payer: Self-pay | Admitting: Internal Medicine

## 2012-11-06 NOTE — Telephone Encounter (Signed)
Spoke with pt and notified of results per Dr. Wert. Pt verbalized understanding and denied any questions. 

## 2012-12-07 ENCOUNTER — Ambulatory Visit: Payer: BC Managed Care – PPO | Admitting: Internal Medicine

## 2012-12-07 ENCOUNTER — Ambulatory Visit (INDEPENDENT_AMBULATORY_CARE_PROVIDER_SITE_OTHER): Payer: BC Managed Care – PPO | Admitting: Internal Medicine

## 2012-12-07 DIAGNOSIS — J449 Chronic obstructive pulmonary disease, unspecified: Secondary | ICD-10-CM

## 2012-12-07 NOTE — Progress Notes (Signed)
PFT done today. 

## 2012-12-11 ENCOUNTER — Telehealth: Payer: Self-pay | Admitting: *Deleted

## 2012-12-11 ENCOUNTER — Encounter: Payer: Self-pay | Admitting: Internal Medicine

## 2012-12-11 NOTE — Telephone Encounter (Signed)
Message copied by Christen Butter on Mon Dec 11, 2012  3:44 PM ------      Message from: Sandrea Hughs B      Created: Mon Dec 11, 2012  8:50 AM       Let him know pft's c/w mod severe copd with reversible component and make sure has f/u to discuss specifics and rx ------

## 2012-12-11 NOTE — Telephone Encounter (Signed)
LMTCB

## 2012-12-11 NOTE — Telephone Encounter (Signed)
I spoke with patient about results and he verbalized understanding and had no questions 

## 2012-12-11 NOTE — Telephone Encounter (Signed)
Message copied by Christen Butter on Mon Dec 11, 2012  3:13 PM ------      Message from: Sandrea Hughs B      Created: Mon Dec 11, 2012  8:50 AM       Let him know pft's c/w mod severe copd with reversible component and make sure has f/u to discuss specifics and rx ------

## 2012-12-22 ENCOUNTER — Encounter: Payer: Self-pay | Admitting: Internal Medicine

## 2012-12-22 ENCOUNTER — Ambulatory Visit (INDEPENDENT_AMBULATORY_CARE_PROVIDER_SITE_OTHER): Payer: BC Managed Care – PPO | Admitting: Internal Medicine

## 2012-12-22 VITALS — BP 122/78 | HR 90 | Temp 98.0°F | Ht 69.5 in | Wt 226.0 lb

## 2012-12-22 DIAGNOSIS — J449 Chronic obstructive pulmonary disease, unspecified: Secondary | ICD-10-CM

## 2012-12-22 DIAGNOSIS — F17200 Nicotine dependence, unspecified, uncomplicated: Secondary | ICD-10-CM

## 2012-12-22 DIAGNOSIS — J4489 Other specified chronic obstructive pulmonary disease: Secondary | ICD-10-CM

## 2012-12-22 NOTE — Progress Notes (Signed)
  Subjective:    Patient ID: Alex Hart, male    DOB: [birth year]   MRN: [id number]  HPI  15 yowm smoker with doe x 2009 worse 2013 referred 10/30/2012 to pulmonary clinic by Dr Posey Rea   10/30/2012 1st pulmonary eval still smoking  With doe x pushing a mower, rushing when walking dog, otherwise has just learned to pace himself. rec Plan A = automatic = symbicort 160 Take 2 puffs first thing in am and then another 2 puffs about 12 hours later.  Only use your albuterol as a rescue medication  12/22/2012 f/u ov/Oluwateniola Leitch re GOLD III COPD/ still smoking Chief Complaint  Patient presents with  . Follow-up    Pt states breathing is unchanged since the last visit and denies any new co's.       No obvious daytime variabilty or assoc chronic cough or cp or chest tightness, subjective wheeze overt sinus or hb symptoms. No unusual exp hx or h/o childhood pna/ asthma or premature birth to his knowledge.  Sleeping ok without nocturnal  or early am exacerbation  of respiratory  c/o's or need for noct saba. Also denies any obvious fluctuation of symptoms with weather or environmental changes or other aggravating or alleviating factors except as outlined above   Current Medications, Allergies, Past Medical History, Past Surgical History, Family History, and Social History were reviewed in [employer] record.  ROS  The following are not active complaints unless bolded sore throat, dysphagia, dental problems, itching, sneezing,  nasal congestion or excess/ purulent secretions, ear ache,   fever, chills, sweats, unintended wt loss, pleuritic or exertional cp, hemoptysis,  orthopnea pnd or leg swelling, presyncope, palpitations, heartburn, abdominal pain, anorexia, nausea, vomiting, diarrhea  or change in bowel or urinary habits, change in stools or urine, dysuria,hematuria,  rash, arthralgias, visual complaints, headache, numbness weakness or ataxia or problems with walking or  coordination,  change in mood/affect or memory.                  Objective:   Physical Exam  Wt Readings from Last 3 Encounters:  12/22/12 226 lb (102.513 kg)  10/30/12 228 lb 6.4 oz (103.602 kg)  10/17/12 233 lb (105.688 kg)      HEENT mild turbinate edema.  Oropharynx no thrush or excess pnd or cobblestoning.  No JVD or cervical adenopathy. Mild accessory muscle hypertrophy. Trachea midline, nl thryroid. Chest was hyperinflated by percussion with diminished breath sounds and moderate increased exp time without wheeze. Hoover sign positive at mid inspiration. Regular rate and rhythm without murmur gallop or rub or increase P2 or edema.  Abd: no hsm, nl excursion. Ext warm without cyanosis or clubbing.    CXR  10/30/2012 :   New pulmonary vascular congestion superimposed on extensive emphysema.       Assessment & Plan:

## 2012-12-22 NOTE — Patient Instructions (Addendum)
If you stop smokiing now, your lung function should level off where it is - this is the most important aspect of your care  Continue symbicort 160 Take 2 puffs first thing in am and then another 2 puffs about 12 hours later.   Only use your combivent as a rescue medication to be used if you can't catch your breath by resting or doing a relaxed purse lip breathing pattern. The less you use it, the better it will work when you need it.   If you are satisfied with your treatment plan let your doctor know and he/she can either refill your medications or you can return here when your prescription runs out.     If in any way you are not 100% satisfied,  please tell us.  If 100% better, tell your friends!   Marland Kitchen

## 2012-12-23 NOTE — Assessment & Plan Note (Signed)
Chronic - refractory smoker Followed in Pulmonary clinic/ [facility]/ Shirleyann Montero  - HFA  90% 12/22/2012  - PFT's 12/07/12 FEV1  1.12 (33%) ratio 49 and 17% improved p B2, DLCO 65 improved to 92% p correction  Adequate control on present rx, reviewed option to add LAMA but what he really needs now is to stop smoking based on the Fletcher curve  See instructions for specific recommendations which were reviewed directly with the patient who was given a copy with highlighter outlining the key components.   The proper method of use, as well as anticipated side effects, of a metered-dose inhaler are discussed and demonstrated to the patient. Improved effectiveness after extensive coaching during this visit to a level of approximately  90% so continue hfa symbicort and prn combivent

## 2012-12-23 NOTE — Assessment & Plan Note (Signed)
>   3 min  I reviewed the Flethcher curve with patient that basically indicates  if you quit smoking when your best day FEV1 is  preserved above 30% it is less likely you will progress to end stage disease and informed the patient there was no medication on the market that has proven to change the curve or the likelihood of progression.  Therefore stopping smoking and maintaining abstinence is the most important aspect of care, not choice of inhalers or for that matter, doctors.

## 2013-01-08 ENCOUNTER — Ambulatory Visit: Payer: BC Managed Care – PPO | Admitting: Internal Medicine

## 2013-02-16 ENCOUNTER — Encounter: Payer: Self-pay | Admitting: Internal Medicine

## 2013-02-16 ENCOUNTER — Ambulatory Visit (INDEPENDENT_AMBULATORY_CARE_PROVIDER_SITE_OTHER): Payer: BC Managed Care – PPO | Admitting: Internal Medicine

## 2013-02-16 VITALS — BP 125/80 | HR 76 | Temp 98.3°F | Resp 16 | Wt 231.0 lb

## 2013-02-16 DIAGNOSIS — F411 Generalized anxiety disorder: Secondary | ICD-10-CM

## 2013-02-16 DIAGNOSIS — E785 Hyperlipidemia, unspecified: Secondary | ICD-10-CM

## 2013-02-16 DIAGNOSIS — I10 Essential (primary) hypertension: Secondary | ICD-10-CM

## 2013-02-16 DIAGNOSIS — F17200 Nicotine dependence, unspecified, uncomplicated: Secondary | ICD-10-CM

## 2013-02-16 NOTE — Progress Notes (Signed)
   Subjective:    HPI  The patient presents for a follow-up of  chronic hypertension, chronic dyslipidemia, anxiety, COPD, HTN not controlled with medicines. He states BP is nl at home - he did not take Maxzide. He  quit smoking 1 wk ago   He ran out of BP meds 3 d ago  BP Readings from Last 3 Encounters:  02/16/13 152/90  12/22/12 122/78  10/30/12 142/80   Wt Readings from Last 3 Encounters:  02/16/13 231 lb (104.781 kg)  12/22/12 226 lb (102.513 kg)  10/30/12 228 lb 6.4 oz (103.602 kg)      Review of Systems  Constitutional: Negative for fever, appetite change, fatigue and unexpected weight change.  HENT: Negative for nosebleeds, congestion, sore throat, sneezing, trouble swallowing and neck pain.   Eyes: Negative for itching and visual disturbance.  Respiratory: Positive for cough.   Cardiovascular: Negative for chest pain, palpitations and leg swelling.  Gastrointestinal: Negative for nausea, diarrhea, blood in stool and abdominal distention.  Genitourinary: Negative for frequency and hematuria.  Musculoskeletal: Negative for back pain, joint swelling and gait problem.  Skin: Negative for rash.  Neurological: Negative for dizziness, tremors, speech difficulty and weakness.  Psychiatric/Behavioral: Negative for sleep disturbance, dysphoric mood and agitation. The patient is nervous/anxious.        Objective:   Physical Exam  Constitutional: He is oriented to person, place, and time. He appears well-developed.  HENT:  Mouth/Throat: Oropharynx is clear and moist.  Eyes: Conjunctivae are normal. Pupils are equal, round, and reactive to light.  Neck: Normal range of motion. No JVD present. No thyromegaly present.  Cardiovascular: Normal rate, regular rhythm, normal heart sounds and intact distal pulses.  Exam reveals no gallop and no friction rub.   No murmur heard. Pulmonary/Chest: Effort normal and breath sounds normal. No respiratory distress. He has no wheezes. He  has no rales. He exhibits no tenderness.  Abdominal: Soft. Bowel sounds are normal. He exhibits no distension and no mass. There is no tenderness. There is no rebound and no guarding.  Musculoskeletal: Normal range of motion. He exhibits no edema and no tenderness.  Lymphadenopathy:    He has no cervical adenopathy.  Neurological: He is alert and oriented to person, place, and time. He has normal reflexes. No cranial nerve deficit. He exhibits normal muscle tone. Coordination normal.  Skin: Skin is warm and dry. No rash noted.  Psychiatric: He has a normal mood and affect. His behavior is normal. Judgment and thought content normal.   Lab Results  Component Value Date   WBC 9.8 11/03/2012   HGB 16.3 11/03/2012   HCT 49.2 11/03/2012   PLT 330.0 11/03/2012   GLUCOSE 120* 11/03/2012   CHOL 177 06/16/2012   TRIG 107.0 06/16/2012   HDL 40.40 06/16/2012   LDLDIRECT 149.0 02/11/2010   LDLCALC 115* 06/16/2012   ALT 18 01/06/2011   AST 14 01/06/2011   NA 138 11/03/2012   K 4.8 11/03/2012   CL 99 11/03/2012   CREATININE 0.6 11/03/2012   BUN 13 11/03/2012   CO2 31 11/03/2012   TSH 0.62 11/03/2012   PSA 1.13 01/06/2011        Assessment & Plan:

## 2013-02-16 NOTE — Assessment & Plan Note (Signed)
Quit 7/14

## 2013-02-16 NOTE — Assessment & Plan Note (Signed)
  On diet  

## 2013-02-16 NOTE — Assessment & Plan Note (Signed)
Cont Vit D °

## 2013-02-16 NOTE — Assessment & Plan Note (Signed)
Re-start Rx 

## 2013-02-16 NOTE — Assessment & Plan Note (Signed)
Continue with current prescription therapy as reflected on the Med list.  

## 2013-03-29 ENCOUNTER — Telehealth: Payer: Self-pay | Admitting: *Deleted

## 2013-03-29 DIAGNOSIS — Z0000 Encounter for general adult medical examination without abnormal findings: Secondary | ICD-10-CM

## 2013-03-29 DIAGNOSIS — Z0389 Encounter for observation for other suspected diseases and conditions ruled out: Secondary | ICD-10-CM

## 2013-03-29 NOTE — Telephone Encounter (Signed)
Message copied by Merrilyn Puma on Thu Mar 29, 2013 10:23 AM ------      Message from: Etheleen Sia      Created: Fri Feb 16, 2013 10:41 AM      Regarding: LABS       PHYSICAL LABS IN NOV ------

## 2013-03-29 NOTE — Telephone Encounter (Signed)
CPE labs entered.  

## 2013-05-18 ENCOUNTER — Other Ambulatory Visit: Payer: Self-pay | Admitting: *Deleted

## 2013-05-18 MED ORDER — ALBUTEROL SULFATE HFA 108 (90 BASE) MCG/ACT IN AERS: 2.0000 | INHALATION_SPRAY | Freq: Four times a day (QID) | RESPIRATORY_TRACT | Status: DC | PRN

## 2013-05-23 ENCOUNTER — Other Ambulatory Visit: Payer: Self-pay | Admitting: Internal Medicine

## 2013-06-08 ENCOUNTER — Other Ambulatory Visit: Payer: Self-pay | Admitting: Internal Medicine

## 2013-06-09 ENCOUNTER — Other Ambulatory Visit: Payer: Self-pay | Admitting: Internal Medicine

## 2013-06-10 NOTE — Telephone Encounter (Signed)
pls sch ov

## 2013-06-11 ENCOUNTER — Other Ambulatory Visit: Payer: Self-pay | Admitting: Internal Medicine

## 2013-06-11 NOTE — Telephone Encounter (Signed)
rx was sent in 10.31.14. Refill request sent in error.

## 2013-06-14 ENCOUNTER — Other Ambulatory Visit: Payer: Self-pay

## 2013-06-22 ENCOUNTER — Other Ambulatory Visit (INDEPENDENT_AMBULATORY_CARE_PROVIDER_SITE_OTHER): Payer: BC Managed Care – PPO

## 2013-06-22 ENCOUNTER — Ambulatory Visit (INDEPENDENT_AMBULATORY_CARE_PROVIDER_SITE_OTHER): Payer: BC Managed Care – PPO | Admitting: Internal Medicine

## 2013-06-22 ENCOUNTER — Encounter: Payer: Self-pay | Admitting: Internal Medicine

## 2013-06-22 VITALS — BP 150/80 | HR 80 | Temp 97.6°F | Resp 16 | Ht 71.0 in | Wt 225.0 lb

## 2013-06-22 DIAGNOSIS — K219 Gastro-esophageal reflux disease without esophagitis: Secondary | ICD-10-CM

## 2013-06-22 DIAGNOSIS — J449 Chronic obstructive pulmonary disease, unspecified: Secondary | ICD-10-CM

## 2013-06-22 DIAGNOSIS — Z0000 Encounter for general adult medical examination without abnormal findings: Secondary | ICD-10-CM

## 2013-06-22 DIAGNOSIS — Z0389 Encounter for observation for other suspected diseases and conditions ruled out: Secondary | ICD-10-CM

## 2013-06-22 DIAGNOSIS — J4489 Other specified chronic obstructive pulmonary disease: Secondary | ICD-10-CM

## 2013-06-22 DIAGNOSIS — E785 Hyperlipidemia, unspecified: Secondary | ICD-10-CM

## 2013-06-22 DIAGNOSIS — I10 Essential (primary) hypertension: Secondary | ICD-10-CM

## 2013-06-22 DIAGNOSIS — F17200 Nicotine dependence, unspecified, uncomplicated: Secondary | ICD-10-CM

## 2013-06-22 LAB — CBC WITH DIFFERENTIAL/PLATELET
Basophils Absolute: 0 10*3/uL (ref 0.0–0.1)
Basophils Relative: 0.2 % (ref 0.0–3.0)
Eosinophils Absolute: 0.3 10*3/uL (ref 0.0–0.7)
Lymphocytes Relative: 21.8 % (ref 12.0–46.0)
Lymphs Abs: 2.1 10*3/uL (ref 0.7–4.0)
MCHC: 33.4 g/dL (ref 30.0–36.0)
MCV: 94.4 fl (ref 78.0–100.0)
Monocytes Absolute: 0.8 10*3/uL (ref 0.1–1.0)
Neutrophils Relative %: 67 % (ref 43.0–77.0)
RBC: 4.98 Mil/uL (ref 4.22–5.81)
WBC: 10 10*3/uL (ref 4.5–10.5)

## 2013-06-22 LAB — LIPID PANEL
Cholesterol: 191 mg/dL (ref 0–200)
HDL: 41 mg/dL (ref >39.00)
Total CHOL/HDL Ratio: 5
Triglycerides: 58 mg/dL (ref 0.0–149.0)

## 2013-06-22 LAB — URINALYSIS, ROUTINE W REFLEX MICROSCOPIC
Bilirubin Urine: NEGATIVE
Hgb urine dipstick: NEGATIVE
Nitrite: NEGATIVE
Urobilinogen, UA: 0.2 (ref 0.0–1.0)

## 2013-06-22 LAB — HEPATIC FUNCTION PANEL
Albumin: 3.8 g/dL (ref 3.5–5.2)
Total Bilirubin: 0.6 mg/dL (ref 0.3–1.2)
Total Protein: 6.9 g/dL (ref 6.0–8.3)

## 2013-06-22 LAB — BASIC METABOLIC PANEL
BUN: 17 mg/dL (ref 6–23)
CO2: 35 mEq/L — ABNORMAL HIGH (ref 19–32)
Calcium: 9.3 mg/dL (ref 8.4–10.5)
Creatinine, Ser: 0.9 mg/dL (ref 0.4–1.5)
Glucose, Bld: 142 mg/dL — ABNORMAL HIGH (ref 70–99)
Potassium: 4.5 mEq/L (ref 3.5–5.1)

## 2013-06-22 LAB — PSA: PSA: 1.37 ng/mL (ref 0.10–4.00)

## 2013-06-22 MED ORDER — ALBUTEROL SULFATE HFA 108 (90 BASE) MCG/ACT IN AERS: 2.0000 | INHALATION_SPRAY | Freq: Four times a day (QID) | RESPIRATORY_TRACT | Status: DC | PRN

## 2013-06-22 MED ORDER — ALPRAZOLAM 1 MG PO TABS: ORAL_TABLET | ORAL | Status: DC

## 2013-06-22 NOTE — Patient Instructions (Signed)
   Milk free trial (no milk, ice cream, cheese and yogurt) for 4-6 weeks. OK to use almond, coconut, rice or soy milk. "Almond breeze" brand tastes good.  

## 2013-06-22 NOTE — Assessment & Plan Note (Signed)
He stopped smoking today!

## 2013-06-22 NOTE — Assessment & Plan Note (Addendum)
Pulm rehab He will discuss disability w/Dr Sherene Sires

## 2013-06-22 NOTE — Progress Notes (Signed)
Pre visit review using our clinic review tool, if applicable. No additional management support is needed unless otherwise documented below in the visit note. 

## 2013-06-22 NOTE — Assessment & Plan Note (Signed)
Continue with current prescription therapy as reflected on the Med list.  

## 2013-06-23 DIAGNOSIS — Z0000 Encounter for general adult medical examination without abnormal findings: Secondary | ICD-10-CM | POA: Insufficient documentation

## 2013-06-23 NOTE — Assessment & Plan Note (Signed)
Continue with current prescription therapy as reflected on the Med list.  

## 2013-06-23 NOTE — Progress Notes (Signed)
   Subjective:    HPI The patient is here for a wellness exam. The patient has been doing well overall without major physical or psychological issues going on lately, except for SOB...  The patient presents for a follow-up of  chronic hypertension, chronic dyslipidemia, anxiety, COPD, HTN not controlled with medicines. He states BP is nl at home - he did not take Maxzide. He  quit smoking 1 wk ago   He ran out of BP meds 3 d ago  BP Readings from Last 3 Encounters:  06/22/13 150/80  02/16/13 125/80  12/22/12 122/78   Wt Readings from Last 3 Encounters:  06/22/13 225 lb (102.059 kg)  02/16/13 231 lb (104.781 kg)  12/22/12 226 lb (102.513 kg)      Review of Systems  Constitutional: Negative for fever, appetite change, fatigue and unexpected weight change.  HENT: Negative for congestion, nosebleeds, sneezing, sore throat and trouble swallowing.   Eyes: Negative for itching and visual disturbance.  Respiratory: Positive for cough.   Cardiovascular: Negative for chest pain, palpitations and leg swelling.  Gastrointestinal: Negative for nausea, diarrhea, blood in stool and abdominal distention.  Genitourinary: Negative for frequency and hematuria.  Musculoskeletal: Negative for back pain, gait problem, joint swelling and neck pain.  Skin: Negative for rash.  Neurological: Negative for dizziness, tremors, speech difficulty and weakness.  Psychiatric/Behavioral: Negative for sleep disturbance, dysphoric mood and agitation. The patient is nervous/anxious.        Objective:   Physical Exam  Constitutional: He is oriented to person, place, and time. He appears well-developed.  HENT:  Mouth/Throat: Oropharynx is clear and moist.  Eyes: Conjunctivae are normal. Pupils are equal, round, and reactive to light.  Neck: Normal range of motion. No JVD present. No thyromegaly present.  Cardiovascular: Normal rate, regular rhythm, normal heart sounds and intact distal pulses.  Exam reveals  no gallop and no friction rub.   No murmur heard. Pulmonary/Chest: Effort normal and breath sounds normal. No respiratory distress. He has no wheezes. He has no rales. He exhibits no tenderness.  Abdominal: Soft. Bowel sounds are normal. He exhibits no distension and no mass. There is no tenderness. There is no rebound and no guarding.  Musculoskeletal: Normal range of motion. He exhibits no edema and no tenderness.  Lymphadenopathy:    He has no cervical adenopathy.  Neurological: He is alert and oriented to person, place, and time. He has normal reflexes. No cranial nerve deficit. He exhibits normal muscle tone. Coordination normal.  Skin: Skin is warm and dry. No rash noted.  Psychiatric: He has a normal mood and affect. His behavior is normal. Judgment and thought content normal.   Lab Results  Component Value Date   WBC 10.0 06/22/2013   HGB 15.7 06/22/2013   HCT 47.0 06/22/2013   PLT 318.0 06/22/2013   GLUCOSE 142* 06/22/2013   CHOL 191 06/22/2013   TRIG 58.0 06/22/2013   HDL 41.00 06/22/2013   LDLDIRECT 149.0 02/11/2010   LDLCALC 138* 06/22/2013   ALT 18 06/22/2013   AST 14 06/22/2013   NA 140 06/22/2013   K 4.5 06/22/2013   CL 99 06/22/2013   CREATININE 0.9 06/22/2013   BUN 17 06/22/2013   CO2 35* 06/22/2013   TSH 1.06 06/22/2013   PSA 1.37 06/22/2013        Assessment & Plan:

## 2013-06-23 NOTE — Assessment & Plan Note (Signed)
We discussed age appropriate health related issues, including available/recomended screening tests and vaccinations. We discussed a need for adhering to healthy diet and exercise. Labs/EKG were reviewed/ordered. All questions were answered.   

## 2013-06-27 ENCOUNTER — Ambulatory Visit (INDEPENDENT_AMBULATORY_CARE_PROVIDER_SITE_OTHER): Payer: BC Managed Care – PPO | Admitting: Internal Medicine

## 2013-06-27 ENCOUNTER — Ambulatory Visit (INDEPENDENT_AMBULATORY_CARE_PROVIDER_SITE_OTHER)
Admission: RE | Admit: 2013-06-27 | Discharge: 2013-06-27 | Disposition: A | Discharge status: Home / Self Care | Payer: BC Managed Care – PPO | Source: Ambulatory Visit | Attending: Internal Medicine | Admitting: Internal Medicine

## 2013-06-27 ENCOUNTER — Encounter: Payer: Self-pay | Admitting: Internal Medicine

## 2013-06-27 VITALS — BP 170/94 | HR 88 | Temp 98.9°F | Ht 71.0 in | Wt 230.2 lb

## 2013-06-27 DIAGNOSIS — J4489 Other specified chronic obstructive pulmonary disease: Secondary | ICD-10-CM

## 2013-06-27 DIAGNOSIS — I10 Essential (primary) hypertension: Secondary | ICD-10-CM

## 2013-06-27 DIAGNOSIS — J449 Chronic obstructive pulmonary disease, unspecified: Secondary | ICD-10-CM

## 2013-06-27 MED ORDER — PREDNISONE (PAK) 10 MG PO TABS: ORAL_TABLET | ORAL | Status: DC

## 2013-06-27 MED ORDER — AMOXICILLIN-POT CLAVULANATE 875-125 MG PO TABS: 1.0000 | ORAL_TABLET | Freq: Two times a day (BID) | ORAL | Status: DC

## 2013-06-27 MED ORDER — ACLIDINIUM BROMIDE 400 MCG/ACT IN AEPB: 1.0000 | INHALATION_SPRAY | Freq: Two times a day (BID) | RESPIRATORY_TRACT | Status: DC

## 2013-06-27 NOTE — Assessment & Plan Note (Addendum)
Chronic - refractory smoker Followed in Pulmonary clinic/ [facility]/ Nelida Mandarino  - HFA  90% 12/22/2012  - PFT's 12/07/12 FEV1  1.12 (33%) ratio 49 and 17% improved p B2, DLCO 65 improved to 92% p correction  DDX of  difficult airways managment all start with A and  include Adherence, Ace Inhibitors, Acid Reflux, Active Sinus Disease, Alpha 1 Antitripsin deficiency, Anxiety masquerading as Airways dz,  ABPA,  allergy(esp in young), Aspiration (esp in elderly), Adverse effects of DPI,  Active smokers, plus two Bs  = Bronchiectasis and Beta blocker use..and one C= CHF  Adherence is always the initial "prime suspect" and is a multilayered concern that requires a "trust but verify" approach in every patient - starting with knowing how to use medications, especially inhalers, correctly, keeping up with refills and understanding the fundamental difference between maintenance and prns vs those medications only taken for a very short course and then stopped and not refilled.  - The proper method of use, as well as anticipated side effects, of a metered-dose inhaler are discussed and demonstrated to the patient. Improved effectiveness after extensive coaching during this visit to a level of approximately  90% so try tudorza one twice daily and d/c combivent and just use saba 2 puffs q 4h prn  ? Acid (or non-acid) GERD > always difficult to exclude as up to 75% of pts in some series report no assoc GI/ Heartburn symptoms> rec max (24h)  acid suppression and diet restrictions/ reviewed and instructions given in writting  ? Active sinus dz > Augmentin 875 mg take one pill twice daily  X 10 days   Active smoking > says he's quit, reinforced importance

## 2013-06-27 NOTE — Progress Notes (Signed)
Subjective:    Patient ID: Alex Hart, male    DOB: Mar 14, 1956   MRN: [id number]    Brief patient profile:  62 yowm smoker with doe x 2009 worse 2013 referred 10/30/2012 to pulmonary clinic by Dr Posey Rea  History of Present Illness  10/30/2012 1st pulmonary eval still smoking  With doe x pushing a mower, rushing when walking dog, otherwise has just learned to pace himself. rec Plan A = automatic = symbicort 160 Take 2 puffs first thing in am and then another 2 puffs about 12 hours later.  Only use your albuterol as a rescue medication   12/22/2012 f/u ov/Alex Hart re GOLD III COPD/ still smoking Chief Complaint  Patient presents with  . Follow-up    Pt states breathing is unchanged since the last visit and denies any new co's.   If you stop smokiing now, your lung function should level off where it is - this is the most important aspect of your care Continue symbicort 160 Take 2 puffs first thing in am and then another 2 puffs about 12 hours later.  Only use your combivent as a rescue medication    06/27/2013 f/u ov/Alex Hart re: COPD GOLD III/ still smoking  Chief Complaint  Patient presents with  . Follow-up    Pt c/o SOB with any exertion, postnasal drainage, sometimes prod cough with yellow mucous X48m.  Pt states he is overusing rescue inhaler, believes it is is due to him panicking.  For months using more than twice daily combivent   No obvious daytime variabilty or assoc  cp or chest tightness, subjective wheeze overt sinus or hb symptoms. No unusual exp hx or h/o childhood pna/ asthma or premature birth to his knowledge.  Sleeping ok without nocturnal  or early am exacerbation  of respiratory  c/o's or need for noct saba. Also denies any obvious fluctuation of symptoms with weather or environmental changes or other aggravating or alleviating factors except as outlined above   Current Medications, Allergies, Past Medical History, Past Surgical History, Family History, and Social  History were reviewed in [employer] record.  ROS  The following are not active complaints unless bolded sore throat, dysphagia, dental problems, itching, sneezing,  nasal congestion or excess/ purulent secretions, ear ache,   fever, chills, sweats, unintended wt loss, pleuritic or exertional cp, hemoptysis,  orthopnea pnd or leg swelling, presyncope, palpitations, heartburn, abdominal pain, anorexia, nausea, vomiting, diarrhea  or change in bowel or urinary habits, change in stools or urine, dysuria,hematuria,  rash, arthralgias, visual complaints, headache, numbness weakness or ataxia or problems with walking or coordination,  change in mood/affect or memory.                  Objective:   Physical Exam  06/27/2013     230  Wt Readings from Last 3 Encounters:  12/22/12 226 lb (102.513 kg)  10/30/12 228 lb 6.4 oz (103.602 kg)  10/17/12 233 lb (105.688 kg)      HEENT mild turbinate edema.  Oropharynx no thrush or excess pnd or cobblestoning.  No JVD or cervical adenopathy. Mild accessory muscle hypertrophy. Trachea midline, nl thryroid. Chest was hyperinflated by percussion with diminished breath sounds and moderate increased exp time without wheeze. Hoover sign positive at mid inspiration. Regular rate and rhythm without murmur gallop or rub or increase P2 or edema.  Abd: no hsm, nl excursion. Ext warm without cyanosis or clubbing.      CXR  06/27/2013 :  Stable central pulmonary vascular congestion compared to prior exam. No other significant abnormality seen     Assessment & Plan:

## 2013-06-27 NOTE — Assessment & Plan Note (Signed)
rec doule dose of diuretic on prednisone and monitor  Keep in mind issue of ? Cardiac asthma? From diastolic chf though note bnp << 100 in 10/2012 so unlikely

## 2013-06-27 NOTE — Patient Instructions (Addendum)
Stop combivent While on prednisone double your fluid/blood pressure pill to offset fluid retention Change the protonix to 40 mg Take 30- 60 min before your first and last meals of the day until breathing/ coughing better then just one before bfast daily  Augmentin 875 mg take one pill twice daily  X 10 days - take at breakfast and supper with large glass of water.  It would help reduce the usual side effects (diarrhea and yeast infections) if you ate cultured yogurt at lunch.  Prednisone 10 mg take  4 each am x 2 days,   2 each am x 2 days,  1 each am x 2 days and stop  Plan A = automatic= symbicort 160 Take 2 puffs first thing in am and then another 2 puffs about 12 hours later.                 And tudorza one twice daily  Plan B= Backup= proair (albuterol) ok up to 2 puffs every 4 hours but if you need it more than twice daily you need to return here   Plan C = nebulizer (hold off for now and work on maintaining off cigarettes)  Plan D = Doctor, call if needed  Plan E = ER, if all else fails  Please remember to go to the  x-ray department downstairs for your tests - we will call you with the results when they are available.  Monitor your blood pressure carefully at home and call Dr Posey Rea if over 140/85

## 2013-07-17 ENCOUNTER — Other Ambulatory Visit: Payer: Self-pay | Admitting: Internal Medicine

## 2013-07-20 ENCOUNTER — Telehealth: Payer: Self-pay | Admitting: *Deleted

## 2013-07-20 ENCOUNTER — Other Ambulatory Visit: Payer: Self-pay | Admitting: *Deleted

## 2013-07-20 MED ORDER — VARENICLINE TARTRATE 1 MG PO TABS: 1.0000 mg | ORAL_TABLET | Freq: Two times a day (BID) | ORAL | Status: DC

## 2013-07-20 NOTE — Telephone Encounter (Signed)
OK to fill this prescription with additional refills x1 Thank you!  

## 2013-07-20 NOTE — Telephone Encounter (Signed)
Pt called requesting a refill pack of Chantix.  Please advise

## 2013-07-23 ENCOUNTER — Telehealth: Payer: Self-pay | Admitting: Internal Medicine

## 2013-07-23 NOTE — Telephone Encounter (Signed)
No acute change, no change in Recs

## 2013-07-23 NOTE — Telephone Encounter (Signed)
Spoke with the pt  He is requesting cxr results from last visit  Result note states this was d/w pt at ov but he does not remember Please advise thanks!

## 2013-07-23 NOTE — Telephone Encounter (Signed)
Spoke with pt and notified of results per Dr. Wert. Pt verbalized understanding and denied any questions. 

## 2013-07-23 NOTE — Telephone Encounter (Signed)
[email or web address] Siefker, CMA  

## 2013-07-30 ENCOUNTER — Encounter: Payer: Self-pay | Admitting: Internal Medicine

## 2013-07-30 ENCOUNTER — Encounter: Payer: Self-pay | Admitting: *Deleted

## 2013-07-30 ENCOUNTER — Ambulatory Visit (INDEPENDENT_AMBULATORY_CARE_PROVIDER_SITE_OTHER): Payer: BC Managed Care – PPO | Admitting: Internal Medicine

## 2013-07-30 VITALS — BP 140/85 | HR 97 | Ht 71.0 in | Wt 229.2 lb

## 2013-07-30 DIAGNOSIS — R0602 Shortness of breath: Secondary | ICD-10-CM

## 2013-07-30 DIAGNOSIS — R609 Edema, unspecified: Secondary | ICD-10-CM

## 2013-07-30 LAB — BASIC METABOLIC PANEL
BUN: 15 mg/dL (ref 6–23)
CO2: 36 mEq/L — ABNORMAL HIGH (ref 19–32)
Calcium: 8.7 mg/dL (ref 8.4–10.5)
Chloride: 96 mEq/L (ref 96–112)
GFR: 104.96 mL/min (ref >60.00)
Potassium: 4 mEq/L (ref 3.5–5.1)
Sodium: 139 mEq/L (ref 135–145)

## 2013-07-30 NOTE — Patient Instructions (Signed)
Your physician recommends that you schedule a follow-up appointment in: TO BE DETERMINED BASED ON TEST RESULTS  Your physician recommends that you HAVE LAB WORK TODAY  Your physician has requested that you have an echocardiogram. Echocardiography is a painless test that uses sound waves to create images of your heart. It provides your doctor with information about the size and shape of your heart and how well your heart's chambers and valves are working. This procedure takes approximately one hour. There are no restrictions for this procedure.

## 2013-07-30 NOTE — Progress Notes (Addendum)
HPI Patient is a 57 yo who wwas referred for evaluation of edema.   The patient says that he has had LE sweling for about 1 month.  ALso SOB with wheezing  Denies CP .  SOB at times that he is a little light headed He has a history of CAD  Cath woti 70% small diagonal; RCA diffusely disease  Occluded proximally with collaterals   LVEF 60%  FOllowed by Jerilee Hoh.  Last sen in November.  ? Cardiac asthma  BNP was low however.  NOte that the patient continues to smoke  Allergies  Allergen Reactions  . Azor [Amlodipine-Olmesartan]     Dizzy   . Codeine     REACTION: itching  . Crestor [Rosuvastatin]     cramps  . Lipitor [Atorvastatin]     cramps  . Lovastatin     cramps  . Pravachol [Pravastatin Sodium]     achy    Current Outpatient Prescriptions  Medication Sig Dispense Refill  . Aclidinium Bromide (TUDORZA PRESSAIR) 400 MCG/ACT AEPB Inhale 1 puff into the lungs 2 (two) times daily. One twice daily  1 each  11  . albuterol (PROVENTIL HFA;VENTOLIN HFA) 108 (90 BASE) MCG/ACT inhaler Inhale 2 puffs into the lungs every 6 (six) hours as needed for wheezing.  1 Inhaler  3  . budesonide-formoterol (SYMBICORT) 160-4.5 MCG/ACT inhaler Inhale 2 puffs into the lungs 2 (two) times daily.  30.6 g  5  . buprenorphine-naloxone (SUBOXONE) 2-0.5 MG SUBL Place 1 tablet under the tongue.        . COMBIVENT RESPIMAT 20-100 MCG/ACT AERS respimat As directed      . pantoprazole (PROTONIX) 40 MG tablet Take 1 tablet (40 mg total) by mouth daily.  90 tablet  3  . triamterene-hydrochlorothiazide (MAXZIDE-25) 37.5-25 MG per tablet Take 1 each (1 tablet total) by mouth daily.  90 tablet  3  . varenicline (CHANTIX CONTINUING MONTH PAK) 1 MG tablet Take 1 tablet (1 mg total) by mouth 2 (two) times daily.  60 tablet  1   No current facility-administered medications for this visit.    Past Medical History  Diagnosis Date  . Muscle spasm of back   . Blood in stool   . Anxiety   . Arthritis   . Chronic  pain syndrome   . Hx of tonsillectomy   . Bronchitis, chronic   . Hypertension   . Hyperlipidemia   . GERD (gastroesophageal reflux disease)   . COPD (chronic obstructive pulmonary disease)     Past Surgical History  Procedure Laterality Date  . Tonsillectomy    . Inguinal hernia repair  1990  . Back surgery  1992  . Abdominal surgery to remove adhesions from colon      teenager    Family History  Problem Relation Age of Onset  . Cancer Mother   . Stroke Father   . Asthma Sister   . Emphysema Sister     History   Social History  . Marital Status: Married    Spouse Name: N/A    Number of Children: 2  . 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 Smoking status: Former Smoker -- 1.50 packs/day for 40 years    Types: Cigarettes    Quit date: 06/25/2013  . Smokeless tobacco: Never Used     Comment: pt quit smoking 2 days ago.  . Alcohol Use: No  .  Drug Use: No  . Sexual Activity: Yes   Other Topics Concern  . Not on file   Social History Narrative  . No narrative on file    Review of Systems:  All systems reviewed.  They are negative to the above problem except as previously stated.  Vital Signs: BP 140/85  Pulse 97  Ht 5\' 11"  (1.803 m)  Wt 229 lb 3.2 oz (103.964 kg)  BMI 31.98 kg/m2  Physical Exam Patient is in NAD HEENT:  Normocephalic, atraumatic. EOMI, PERRLA.  Neck: JVP is normal.  No bruits.  Lungs: . Rhonchi  No rales  Decreased airflow Heart: Regular rate and rhythm. Normal S1, S2. No S3.   No significant murmurs. PMI not displaced.  Abdomen:  Supple, nontender. Normal bowel sounds. No masses. No hepatomegaly.  Extremities:   Good distal pulses throughout. Tr lower extremity edema.  Musculoskeletal :moving all extremities.  Neuro:   alert and oriented x3.  CN II-XII grossly intact.  EKG  11/19  SR  Nonspecific ST T wave changes.    Impression  1.  SOB  Patient with signif pulm  dz  Also with signif CAD  Would recomm echo to evaluate systolic and diastolic cardiac function If LVEF is normal consider stress test  If decreased consider cath.  Check labs   Continue activities as tolerated.  2.  CAD As above  3.  Lipids  Not on a statin  Had problems with lipitor  Discussed livalo.   Will check lipids

## 2013-07-31 ENCOUNTER — Ambulatory Visit (HOSPITAL_COMMUNITY): Payer: BC Managed Care – PPO | Attending: Internal Medicine | Admitting: Cardiology

## 2013-07-31 DIAGNOSIS — R0602 Shortness of breath: Secondary | ICD-10-CM | POA: Insufficient documentation

## 2013-07-31 DIAGNOSIS — R609 Edema, unspecified: Secondary | ICD-10-CM | POA: Insufficient documentation

## 2013-07-31 DIAGNOSIS — I2510 Atherosclerotic heart disease of native coronary artery without angina pectoris: Secondary | ICD-10-CM | POA: Insufficient documentation

## 2013-07-31 NOTE — Progress Notes (Signed)
Echo performed. 

## 2013-08-03 ENCOUNTER — Other Ambulatory Visit: Payer: Self-pay | Admitting: Internal Medicine

## 2013-08-03 ENCOUNTER — Telehealth: Payer: Self-pay | Admitting: Internal Medicine

## 2013-08-03 DIAGNOSIS — R0602 Shortness of breath: Secondary | ICD-10-CM

## 2013-08-03 NOTE — Telephone Encounter (Signed)
New problem    Spoke with Dr. Tenny Craw on Wednesday ,   Was told that someone will be calling him to set up a doppler test. Patient is unsure what test he need to have done.

## 2013-08-03 NOTE — Telephone Encounter (Addendum)
Spoke with pt, he is scheduled for his cath Monday 08-06-13 @ 10:30 am. Instructions discussed over the phone. Pt will have labs at the hosp that morning. Dr Tenny Craw aware and to do the orders.

## 2013-08-06 ENCOUNTER — Encounter: Payer: Self-pay | Admitting: Internal Medicine

## 2013-08-06 ENCOUNTER — Encounter (HOSPITAL_COMMUNITY): Payer: Self-pay | Admitting: *Deleted

## 2013-08-06 ENCOUNTER — Ambulatory Visit (HOSPITAL_COMMUNITY)
Admission: RE | Admit: 2013-08-06 | Discharge: 2013-08-06 | Disposition: A | Discharge status: Home / Self Care | Payer: BC Managed Care – PPO | Source: Ambulatory Visit | Attending: Cardiology | Admitting: Cardiology

## 2013-08-06 ENCOUNTER — Encounter (HOSPITAL_COMMUNITY)
Admission: RE | Disposition: A | Discharge status: Home / Self Care | Payer: Self-pay | Source: Ambulatory Visit | Attending: Cardiology

## 2013-08-06 DIAGNOSIS — G894 Chronic pain syndrome: Secondary | ICD-10-CM | POA: Insufficient documentation

## 2013-08-06 DIAGNOSIS — K219 Gastro-esophageal reflux disease without esophagitis: Secondary | ICD-10-CM | POA: Insufficient documentation

## 2013-08-06 DIAGNOSIS — Z87891 Personal history of nicotine dependence: Secondary | ICD-10-CM | POA: Insufficient documentation

## 2013-08-06 DIAGNOSIS — R0602 Shortness of breath: Secondary | ICD-10-CM

## 2013-08-06 DIAGNOSIS — E785 Hyperlipidemia, unspecified: Secondary | ICD-10-CM | POA: Insufficient documentation

## 2013-08-06 DIAGNOSIS — I10 Essential (primary) hypertension: Secondary | ICD-10-CM | POA: Insufficient documentation

## 2013-08-06 DIAGNOSIS — J4489 Other specified chronic obstructive pulmonary disease: Secondary | ICD-10-CM | POA: Insufficient documentation

## 2013-08-06 DIAGNOSIS — I2789 Other specified pulmonary heart diseases: Secondary | ICD-10-CM | POA: Insufficient documentation

## 2013-08-06 DIAGNOSIS — I2510 Atherosclerotic heart disease of native coronary artery without angina pectoris: Secondary | ICD-10-CM | POA: Insufficient documentation

## 2013-08-06 DIAGNOSIS — J449 Chronic obstructive pulmonary disease, unspecified: Secondary | ICD-10-CM | POA: Insufficient documentation

## 2013-08-06 DIAGNOSIS — I2781 Cor pulmonale (chronic): Secondary | ICD-10-CM | POA: Insufficient documentation

## 2013-08-06 HISTORY — PX: LEFT AND RIGHT HEART CATHETERIZATION WITH CORONARY ANGIOGRAM: SHX5449

## 2013-08-06 LAB — PROTIME-INR
INR: 0.93 (ref 0.00–1.49)
Prothrombin Time: 12.3 seconds (ref 11.6–15.2)

## 2013-08-06 LAB — POCT I-STAT 3, VENOUS BLOOD GAS (G3P V)
Acid-Base Excess: 2 mmol/L (ref 0.0–2.0)
O2 Saturation: 63 %

## 2013-08-06 LAB — CBC
HCT: 54.4 % — ABNORMAL HIGH (ref 39.0–52.0)
Hemoglobin: 18.3 g/dL — ABNORMAL HIGH (ref 13.0–17.0)
MCHC: 33.6 g/dL (ref 30.0–36.0)
RBC: 5.66 MIL/uL (ref 4.22–5.81)
RDW: 13.5 % (ref 11.5–15.5)
WBC: 11.9 10*3/uL — ABNORMAL HIGH (ref 4.0–10.5)

## 2013-08-06 SURGERY — LEFT AND RIGHT HEART CATHETERIZATION WITH CORONARY ANGIOGRAM
Anesthesia: LOCAL

## 2013-08-06 MED ORDER — ONDANSETRON HCL 4 MG/2ML IJ SOLN: 4.0000 mg | Freq: Four times a day (QID) | INTRAMUSCULAR | Status: DC | PRN

## 2013-08-06 MED ORDER — SODIUM CHLORIDE 0.9 % IV SOLN: 250.0000 mL | INTRAVENOUS | Status: DC | PRN

## 2013-08-06 MED ORDER — ACETAMINOPHEN 325 MG PO TABS: 650.0000 mg | ORAL_TABLET | ORAL | Status: DC | PRN

## 2013-08-06 MED ORDER — SODIUM CHLORIDE 0.9 % IV SOLN
1.0000 mL/kg/h | INTRAVENOUS | Status: DC
Start: 2013-08-06 — End: 2013-08-06

## 2013-08-06 MED ORDER — ASPIRIN 81 MG PO CHEW
81.0000 mg | CHEWABLE_TABLET | ORAL | Status: AC
  Administered 2013-08-06: 81 mg via ORAL
  Filled 2013-08-06: qty 1

## 2013-08-06 MED ORDER — SODIUM CHLORIDE 0.9 % IJ SOLN: 3.0000 mL | Freq: Two times a day (BID) | INTRAMUSCULAR | Status: DC

## 2013-08-06 MED ORDER — MIDAZOLAM HCL 2 MG/2ML IJ SOLN
INTRAMUSCULAR | Status: AC
  Filled 2013-08-06: qty 2

## 2013-08-06 MED ORDER — SODIUM CHLORIDE 0.9 % IJ SOLN: 3.0000 mL | INTRAMUSCULAR | Status: DC | PRN

## 2013-08-06 MED ORDER — SODIUM CHLORIDE 0.9 % IV SOLN
INTRAVENOUS | Status: DC
  Administered 2013-08-06: 50 mL/h via INTRAVENOUS

## 2013-08-06 MED ORDER — FENTANYL CITRATE 0.05 MG/ML IJ SOLN
INTRAMUSCULAR | Status: AC
  Filled 2013-08-06: qty 2

## 2013-08-06 NOTE — CV Procedure (Signed)
   Cardiac Catheterization Procedure Note  Name: Alex Hart MRN: [id number] DOB: [birth year]  Procedure: Right Heart Cath, Left Heart Cath, Selective Coronary Angiography, LV angiography  Indication: 57 yo WM with known history of CAD with total occlusion of the proximal RCA by cardiac cath in 2004. He has a history of tobacco abuse and COPD. He has worsening symptoms of dyspnea.   Procedural Details: The right groin was prepped, draped, and anesthetized with 1% lidocaine. Using the modified Seldinger technique a 5 French sheath was placed in the right femoral artery and a 7 French sheath was placed in the right femoral vein. A Swan-Ganz catheter was used for the right heart catheterization. Standard protocol was followed for recording of right heart pressures and sampling of oxygen saturations. Fick cardiac output was calculated. Standard Judkins catheters were used for selective coronary angiography and left ventriculography. There were no immediate procedural complications. The patient was transferred to the post catheterization recovery area for further monitoring.  Procedural Findings: Hemodynamics RA 12/12 mean 10 mm Hg RV 63/12 mm Hg PA 64/31 mean 45 mm Hg PCWP 15/16 mean 13 mm Hg LV 123/10 mm Hg AO 118/77 mean 94 mm Hg  Oxygen saturations: PA 63% AO 92%  Cardiac Output (Fick) 4.11 L/min  Cardiac Index (Fick) 1.84 L/min/m2   Coronary angiography: Coronary dominance: right  Left mainstem: Short, normal  Left anterior descending (LAD):  Large vessel extending to the apex. Moderate calcification in the proximal vessel with 30% disease. 40% stenosis at the bifurcation with the second diagonal. The first diagonal is tiny with 80-90% ostial stenosis. The second diagonal is large and is without significant disease.   Left circumflex (LCx): The left circumflex gives rise to a large branching OM. It has mild disease up to 20%.  Right coronary artery (RCA): The RCA is a large  dominant vessel. The proximal occlusion has recanalized and now appears widely patent with less than 30% disease. The mid vessel is diffusely diseased with a 70-80% stenosis followed by a 90% stenosis. There are left to right collaterals to the distal RCA.  Left ventriculography: Left ventricular systolic function is normal, LVEF is estimated at 55-65%, there is no significant mitral regurgitation. There is a small diverticulum of the inferior wall.   Final Conclusions:   1. Single vessel obstructive CAD 2. Normal LV function 3. Moderate pulmonary HTN. Normal LV filling pressures.  Recommendations: I think his symptoms of dyspnea are more likely related to his COPD and pulmonary HTN. The disease in the RCA is chronic and if anything improved since 2004 with recanalization of the proximal RCA occlusion. If he has significant ischemia on stress testing despite optimal antianginal therapy PCI of the RCA could be considered.   Theron Arista [facility] 08/06/2013, 11:51 AM

## 2013-08-06 NOTE — Progress Notes (Signed)
Received pt from cath procedure alert and able to tolerate fluids. 

## 2013-08-06 NOTE — Progress Notes (Signed)
Discharge instruction given per MD order.  Pt and CG verbalize understanding.  Pt to car via wheelchair. 

## 2013-08-06 NOTE — H&P (View-Only) (Signed)
HPI Patient is a 57 yo who wwas referred for evaluation of edema.   The patient says that he has had LE sweling for about 1 month.  ALso SOB with wheezing  Denies CP .  SOB at times that he is a little light headed He has a history of CAD  Cath woti 70% small diagonal; RCA diffusely disease  Occluded proximally with collaterals   LVEF 60%  FOllowed by M Wert.  Last sen in November.  ? Cardiac asthma  BNP was low however.  NOte that the patient continues to smoke  Allergies  Allergen Reactions  . Azor [Amlodipine-Olmesartan]     Dizzy   . Codeine     REACTION: itching  . Crestor [Rosuvastatin]     cramps  . Lipitor [Atorvastatin]     cramps  . Lovastatin     cramps  . Pravachol [Pravastatin Sodium]     achy    Current Outpatient Prescriptions  Medication Sig Dispense Refill  . Aclidinium Bromide (TUDORZA PRESSAIR) 400 MCG/ACT AEPB Inhale 1 puff into the lungs 2 (two) times daily. One twice daily  1 each  11  . albuterol (PROVENTIL HFA;VENTOLIN HFA) 108 (90 BASE) MCG/ACT inhaler Inhale 2 puffs into the lungs every 6 (six) hours as needed for wheezing.  1 Inhaler  3  . budesonide-formoterol (SYMBICORT) 160-4.5 MCG/ACT inhaler Inhale 2 puffs into the lungs 2 (two) times daily.  30.6 g  5  . buprenorphine-naloxone (SUBOXONE) 2-0.5 MG SUBL Place 1 tablet under the tongue.        . COMBIVENT RESPIMAT 20-100 MCG/ACT AERS respimat As directed      . pantoprazole (PROTONIX) 40 MG tablet Take 1 tablet (40 mg total) by mouth daily.  90 tablet  3  . triamterene-hydrochlorothiazide (MAXZIDE-25) 37.5-25 MG per tablet Take 1 each (1 tablet total) by mouth daily.  90 tablet  3  . varenicline (CHANTIX CONTINUING MONTH PAK) 1 MG tablet Take 1 tablet (1 mg total) by mouth 2 (two) times daily.  60 tablet  1   No current facility-administered medications for this visit.    Past Medical History  Diagnosis Date  . Muscle spasm of back   . Blood in stool   . Anxiety   . Arthritis   . Chronic  pain syndrome   . Hx of tonsillectomy   . Bronchitis, chronic   . Hypertension   . Hyperlipidemia   . GERD (gastroesophageal reflux disease)   . COPD (chronic obstructive pulmonary disease)     Past Surgical History  Procedure Laterality Date  . Tonsillectomy    . Inguinal hernia repair  1990  . Back surgery  1992  . Abdominal surgery to remove adhesions from colon      teenager    Family History  Problem Relation Age of Onset  . Cancer Mother   . Stroke Father   . Asthma Sister   . Emphysema Sister     History   Social History  . Marital Status: Married    Spouse Name: N/A    Number of Children: 2  . 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 Smoking status: Former Smoker -- 1.50 packs/day for 40 years    Types: Cigarettes    Quit date: 06/25/2013  . Smokeless tobacco: Never Used     Comment: pt quit smoking 2 days ago.  . Alcohol Use: No  .   Drug Use: No  . Sexual Activity: Yes   Other Topics Concern  . Not on file   Social History Narrative  . No narrative on file    Review of Systems:  All systems reviewed.  They are negative to the above problem except as previously stated.  Vital Signs: BP 140/85  Pulse 97  Ht 5' 11" (1.803 m)  Wt 229 lb 3.2 oz (103.964 kg)  BMI 31.98 kg/m2  Physical Exam Patient is in NAD HEENT:  Normocephalic, atraumatic. EOMI, PERRLA.  Neck: JVP is normal.  No bruits.  Lungs: . Rhonchi  No rales  Decreased airflow Heart: Regular rate and rhythm. Normal S1, S2. No S3.   No significant murmurs. PMI not displaced.  Abdomen:  Supple, nontender. Normal bowel sounds. No masses. No hepatomegaly.  Extremities:   Good distal pulses throughout. Tr lower extremity edema.  Musculoskeletal :moving all extremities.  Neuro:   alert and oriented x3.  CN II-XII grossly intact.  EKG  11/19  SR  Nonspecific ST T wave changes.    Impression  1.  SOB  Patient with signif pulm  dz  Also with signif CAD  Would recomm echo to evaluate systolic and diastolic cardiac function If LVEF is normal consider stress test  If decreased consider cath.  Check labs   Continue activities as tolerated.  2.  CAD As above  3.  Lipids  Not on a statin  Had problems with lipitor  Discussed livalo.   Will check lipids     

## 2013-08-06 NOTE — Interval H&P Note (Signed)
History and Physical Interval Note:  08/06/2013 11:09 AM  Alex Hart  has presented today for surgery, with the diagnosis of rv dysfunction pulmonary hypertension  The various methods of treatment have been discussed with the patient and family. After consideration of risks, benefits and other options for treatment, the patient has consented to  Procedure(s): LEFT AND RIGHT HEART CATHETERIZATION WITH CORONARY ANGIOGRAM (N/A) as a surgical intervention .  The patient's history has been reviewed, patient examined, no change in status, stable for surgery.  I have reviewed the patient's chart and labs.  Questions were answered to the patient's satisfaction.   Cath Lab Visit (complete for each Cath Lab visit)  Clinical Evaluation Leading to the Procedure:   ACS: no  Non-ACS:    Anginal Classification: CCS III  Anti-ischemic medical therapy: No Therapy  Non-Invasive Test Results: No non-invasive testing performed  Prior CABG: No previous CABG        Theron Arista [facility] 08/06/2013 11:09 AM

## 2013-08-07 LAB — POCT I-STAT 3, ART BLOOD GAS (G3+)
pCO2 arterial: 55.8 mmHg — ABNORMAL HIGH (ref 35.0–45.0)
pH, Arterial: 7.363 (ref 7.350–7.450)
pO2, Arterial: 66 mmHg — ABNORMAL LOW (ref 80.0–100.0)

## 2013-08-15 ENCOUNTER — Encounter: Payer: Self-pay | Admitting: Internal Medicine

## 2013-08-24 ENCOUNTER — Encounter: Payer: Self-pay | Admitting: Internal Medicine

## 2013-08-24 ENCOUNTER — Ambulatory Visit (INDEPENDENT_AMBULATORY_CARE_PROVIDER_SITE_OTHER): Payer: BC Managed Care – PPO | Admitting: Internal Medicine

## 2013-08-24 VITALS — BP 130/72 | HR 89 | Ht 71.0 in | Wt 218.0 lb

## 2013-08-24 DIAGNOSIS — Z79899 Other long term (current) drug therapy: Secondary | ICD-10-CM

## 2013-08-24 DIAGNOSIS — E785 Hyperlipidemia, unspecified: Secondary | ICD-10-CM

## 2013-08-24 DIAGNOSIS — I10 Essential (primary) hypertension: Secondary | ICD-10-CM

## 2013-08-24 NOTE — Patient Instructions (Signed)
Your physician wants you to follow-up in: August 2015 with Dr. Harrington Challenger.  You will receive a reminder letter in the mail two months in advance. If you don't receive a letter, please call our office to schedule the follow-up appointment.  Your physician recommends that you return for lab work at the end of February for FASTING lipid and liver panel.  Please schedule a follow up appointment with Dr. Melvyn Novas.  Continue with medicines as listed.

## 2013-08-24 NOTE — Progress Notes (Signed)
HPI Patient is a 58 yo who wwas referred for evaluation of edema and SOB  Has hsitroy of COPD   After I saw him he underwent R and L heart cath   :  Hemodynamics  RA 12/12 mean 10 mm Hg  RV 63/12 mm Hg  PA 64/31 mean 45 mm Hg  PCWP 15/16 mean 13 mm Hg  LV 123/10 mm Hg  AO 118/77 mean 94 mm Hg  Oxygen saturations:  PA 63%  AO 92%  Cardiac Output (Fick) 4.11 L/min  Cardiac Index (Fick) 1.84 L/min/m2  Coronary angiography:  Coronary dominance: right  Left mainstem: Short, normal  Left anterior descending (LAD): Large vessel extending to the apex. Moderate calcification in the proximal vessel with 30% disease. 40% stenosis at the bifurcation with the second diagonal. The first diagonal is tiny with 80-90% ostial stenosis. The second diagonal is large and is without significant disease.  Left circumflex (LCx): The left circumflex gives rise to a large branching OM. It has mild disease up to 20%.  Right coronary artery (RCA): The RCA is a large dominant vessel. The proximal occlusion has recanalized and now appears widely patent with less than 30% disease. The mid vessel is diffusely diseased with a 70-80% stenosis followed by a 90% stenosis. There are left to right collaterals to the distal RCA.  Left ventriculography: Left ventricular systolic function is normal, LVEF is estimated at 55-65%, there is no significant mitral regurgitation. There is a small diverticulum of the inferior wall.  Final Conclusions:  1. Single vessel obstructive CAD  2. Normal LV function  3. Moderate pulmonary HTN. Normal LV filling pressures.  Recommendations: I think his symptoms of dyspnea are more likely related to his COPD and pulmonary HTN. The disease in the RCA is chronic and if anything improved since 2004 with recanalization of the proximal RCA occlusion. If he has significant ischemia on stress testing despite optimal antianginal therapy PCI of the RCA could be considered.   NOte that the patient  continues to smoke  Has cut back on tobacco Since seen his breathing is about the same.    Allergies  Allergen Reactions  . Azor [Amlodipine-Olmesartan]     Dizzy   . Codeine     REACTION: itching  . Crestor [Rosuvastatin]     cramps  . Lipitor [Atorvastatin]     cramps  . Lovastatin     cramps  . Pravachol [Pravastatin Sodium]     achy    Current Outpatient Prescriptions  Medication Sig Dispense Refill  . Aclidinium Bromide (TUDORZA PRESSAIR) 400 MCG/ACT AEPB Inhale 1 puff into the lungs 2 (two) times daily. One twice daily  1 each  11  . albuterol (PROVENTIL HFA;VENTOLIN HFA) 108 (90 BASE) MCG/ACT inhaler Inhale 2 puffs into the lungs every 6 (six) hours as needed for wheezing.  1 Inhaler  3  . budesonide-formoterol (SYMBICORT) 160-4.5 MCG/ACT inhaler Inhale 2 puffs into the lungs 2 (two) times daily.  30.6 g  5  . buprenorphine-naloxone (SUBOXONE) 2-0.5 MG SUBL Place 1 tablet under the tongue.        . COMBIVENT RESPIMAT 20-100 MCG/ACT AERS respimat As directed      . pantoprazole (PROTONIX) 40 MG tablet Take 1 tablet (40 mg total) by mouth daily.  90 tablet  3  . triamterene-hydrochlorothiazide (MAXZIDE-25) 37.5-25 MG per tablet Take 1 each (1 tablet total) by mouth daily.  90 tablet  3  . varenicline (CHANTIX CONTINUING MONTH PAK) 1 MG  tablet Take 1 tablet (1 mg total) by mouth 2 (two) times daily.  60 tablet  1   No current facility-administered medications for this visit.    Past Medical History  Diagnosis Date  . Muscle spasm of back   . Blood in stool   . Anxiety   . Arthritis   . Chronic pain syndrome   . Hx of tonsillectomy   . Bronchitis, chronic   . Hypertension   . Hyperlipidemia   . GERD (gastroesophageal reflux disease)   . COPD (chronic obstructive pulmonary disease)     Past Surgical History  Procedure Laterality Date  . Tonsillectomy    . Inguinal hernia repair  1990  . Back surgery  1992  . Abdominal surgery to remove adhesions from colon       teenager    Family History  Problem Relation Age of Onset  . Cancer Mother   . Stroke Father   . Asthma Sister   . Emphysema Sister     History   Social History  . Marital Status: Married    Spouse Name: N/A    Number of Children: 2  . 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 Smoking status: Former Smoker -- 1.50 packs/day for 40 years    Types: Cigarettes    Quit date: 06/25/2013  . Smokeless tobacco: Never Used     Comment: pt quit smoking 2 days ago.  . Alcohol Use: No  . Drug Use: No  . Sexual Activity: Yes   Other Topics Concern  . Not on file   Social History Narrative  . No narrative on file    Review of Systems:  All systems reviewed.  They are negative to the above problem except as previously stated.  Vital Signs: BP 130/72  Pulse 89  Ht 5\' 11"  (1.803 m)  Wt 218 lb (98.884 kg)  BMI 30.42 kg/m2  Physical Exam Patient is in NAD HEENT:  Normocephalic, atraumatic. EOMI, PERRLA.  Neck: JVP is normal.  No bruits.  Lungs: . Rhonchi  No rales  Decreased airflow Heart: Regular rate and rhythm. Normal S1, S2. No S3.   No significant murmurs. PMI not displaced.  Abdomen:  Supple, nontender. Normal bowel sounds. No masses. No hepatomegaly.  Extremities:   Good distal pulses throughout. Tr lower extremity edema.  Musculoskeletal :moving all extremities.  Neuro:   alert and oriented x3.  CN II-XII grossly intact.  EKG  11/19  SR  Nonspecific ST T wave changes.    Impression  1.  SOB   Cath showed dz as noted above which is not worse from 2004, actually RCA was recanalized.  Not convinced it is leading to his symptoms.   Moderate pulmoary HTN probab from COPD Will defer to [facility] for pulm f/u  2.  CAD As above   3.  Lipids Patient started Livalo  F/U lipids at the end of Feb with LFT  4.  Tob  Patient down to 10 cigs per day  Congratulated.  Continue to try to quit  F/U in Aug

## 2013-08-27 ENCOUNTER — Ambulatory Visit (INDEPENDENT_AMBULATORY_CARE_PROVIDER_SITE_OTHER): Payer: BC Managed Care – PPO | Admitting: Internal Medicine

## 2013-08-27 ENCOUNTER — Encounter: Payer: Self-pay | Admitting: Internal Medicine

## 2013-08-27 VITALS — BP 130/80 | HR 90 | Temp 98.7°F

## 2013-08-27 DIAGNOSIS — F17200 Nicotine dependence, unspecified, uncomplicated: Secondary | ICD-10-CM

## 2013-08-27 DIAGNOSIS — J4489 Other specified chronic obstructive pulmonary disease: Secondary | ICD-10-CM

## 2013-08-27 DIAGNOSIS — I2781 Cor pulmonale (chronic): Secondary | ICD-10-CM

## 2013-08-27 DIAGNOSIS — J449 Chronic obstructive pulmonary disease, unspecified: Secondary | ICD-10-CM

## 2013-08-27 DIAGNOSIS — I279 Pulmonary heart disease, unspecified: Secondary | ICD-10-CM

## 2013-08-27 NOTE — Assessment & Plan Note (Signed)

## 2013-08-27 NOTE — Patient Instructions (Signed)
Please see patient coordinator before you leave today  to schedule overnight oximetry on [facility]  Please schedule a follow up visit in 3 months but call sooner if needed

## 2013-08-27 NOTE — Progress Notes (Signed)
Subjective:    Patient ID: Alex Hart, male    DOB: 02-16-1956   MRN: [id number]    Brief patient profile:  50 yowm smoker with doe x 2009 worse 2013 referred 10/30/2012 to pulmonary clinic by Dr Alain Marion  History of Present Illness  10/30/2012 1st pulmonary eval still smoking  With doe x pushing a mower, rushing when walking dog, otherwise has just learned to pace himself. rec Plan A = automatic = symbicort 160 Take 2 puffs first thing in am and then another 2 puffs about 12 hours later.  Only use your albuterol as a rescue medication   12/22/2012 f/u ov/Terrisha Lopata re GOLD III COPD/ still smoking Chief Complaint  Patient presents with  . Follow-up    Pt states breathing is unchanged since the last visit and denies any new co's.   If you stop smokiing now, your lung function should level off where it is - this is the most important aspect of your care Continue symbicort 160 Take 2 puffs first thing in am and then another 2 puffs about 12 hours later.  Only use your combivent as a rescue medication    06/27/2013 f/u ov/Ulyana Pitones re: COPD GOLD III/ still smoking  Chief Complaint  Patient presents with  . Follow-up    Pt c/o SOB with any exertion, postnasal drainage, sometimes prod cough with yellow mucous X49m.  Pt states he is overusing rescue inhaler, believes it is is due to him panicking.  For months using more than twice daily combivent  Stop combivent While on prednisone double your fluid/blood pressure pill to offset fluid retention Change the protonix to 40 mg Take 30- 60 min before your first and last meals of the day until breathing/ coughing better then just one before bfast daily  Augmentin 875 mg take one pill twice daily  X 10 days - take at breakfast and supper with large glass of water.  It would help reduce the usual side effects (diarrhea and yeast infections) if you ate cultured yogurt at lunch.  Prednisone 10 mg take  4 each am x 2 days,   2 each am x 2 days,  1 each am x 2  days and stop Plan A = automatic= symbicort 160 Take 2 puffs first thing in am and then another 2 puffs about 12 hours later.  And tudorza one twice daily Plan B= Backup= proair (albuterol) ok up to 2 puffs every 4 hours but if you need it more than twice daily you need to return here  Plan C = nebulizer (hold off for now and work on maintaining off cigarettes) Plan D = Doctor, call if needed Plan E = ER, if all else fails Monitor your blood pressure carefully at home and call Dr Alain Marion if over 140/85   08/27/2013 f/u ov/Kelissa Merlin re: gold III,  Still smoking complicated  By mod PAH/ cor pulmonale Chief Complaint  Patient presents with  . Follow-up    Pt states that his breathing is doing well and denies any new co's today.  using proair maybe twice daily at most - never neb, much better vs before tudorza  No obvious daytime variabilty or assoc cough or   cp or chest tightness, subjective wheeze overt sinus or hb symptoms. No unusual exp hx or h/o childhood pna/ asthma or premature birth to his knowledge.  Sleeping ok without nocturnal  or early am exacerbation  of respiratory  c/o's or need for noct saba. Also denies any obvious  fluctuation of symptoms with weather or environmental changes or other aggravating or alleviating factors except as outlined above   Current Medications, Allergies, Past Medical History, Past Surgical History, Family History, and Social History were reviewed in [employer] record.  ROS  The following are not active complaints unless bolded sore throat, dysphagia, dental problems, itching, sneezing,  nasal congestion or excess/ purulent secretions, ear ache,   fever, chills, sweats, unintended wt loss, pleuritic or exertional cp, hemoptysis,  orthopnea pnd or leg swelling, presyncope, palpitations, heartburn, abdominal pain, anorexia, nausea, vomiting, diarrhea  or change in bowel or urinary habits, change in stools or urine, dysuria,hematuria,   rash, arthralgias, visual complaints, headache, numbness weakness or ataxia or problems with walking or coordination,  change in mood/affect or memory.                  Objective:   Physical Exam  06/27/2013     230  > 08/27/2013  216  Wt Readings from Last 3 Encounters:  12/22/12 226 lb (102.513 kg)  10/30/12 228 lb 6.4 oz (103.602 kg)  10/17/12 233 lb (105.688 kg)      HEENT mild turbinate edema.  Oropharynx no thrush or excess pnd or cobblestoning.  No JVD or cervical adenopathy. Mild accessory muscle hypertrophy. Trachea midline, nl thryroid. Chest was hyperinflated by percussion with diminished breath sounds and moderate increased exp time without wheeze. Hoover sign positive at mid inspiration. Regular rate and rhythm without murmur gallop or rub or increase P2 or edema.  Abd: no hsm, nl excursion. Ext warm without cyanosis or clubbing.      CXR  06/27/2013 :   Stable central pulmonary vascular congestion compared to prior exam. No other significant abnormality seen     Assessment & Plan:

## 2013-08-27 NOTE — Assessment & Plan Note (Signed)
-   see [facility] 08/06/2013  RA 12/12 mean 10 mm Hg  RV 63/12 mm Hg  PA 64/31 mean 45 mm Hg  PCWP 15/16 mean 13 mm Hg  LV 123/10 mm Hg  AO 118/77 mean 94 mm Hg PVR  7.8  Explained seriousness of cor pulmonale in prognosis of copd, key is to determine whether has adequate  02 sats    - 08/27/2013 ono RA  ordered - see smoking a/p

## 2013-08-27 NOTE — Assessment & Plan Note (Signed)
-   HFA  90% 12/22/2012  - PFT's 12/07/12 FEV1  1.12 (33%) ratio 49 and 17% improved p B2, DLCO 65 improved to 92% p correction  Adequate control on present rx despite smoking , reviewed > no change in rx needed

## 2013-08-31 ENCOUNTER — Telehealth: Payer: Self-pay | Admitting: Internal Medicine

## 2013-08-31 ENCOUNTER — Other Ambulatory Visit: Payer: Self-pay

## 2013-08-31 ENCOUNTER — Telehealth: Payer: Self-pay

## 2013-08-31 DIAGNOSIS — J449 Chronic obstructive pulmonary disease, unspecified: Secondary | ICD-10-CM

## 2013-08-31 MED ORDER — PITAVASTATIN CALCIUM 4 MG PO TABS
1.0000 | ORAL_TABLET | Freq: Every day | ORAL | Status: DC
Start: 2013-08-31 — End: 2013-11-12

## 2013-08-31 NOTE — Telephone Encounter (Signed)
Pt called back. He reports he had ONO done on Tuesday. Pt is aware MW is not in until next week. Please advise MW thanks

## 2013-08-31 NOTE — Telephone Encounter (Signed)
lmomtcb x1 for pt. Need to make aware MW not in until next week

## 2013-09-03 ENCOUNTER — Telehealth: Payer: Self-pay | Admitting: Internal Medicine

## 2013-09-03 NOTE — Telephone Encounter (Signed)
error 

## 2013-09-03 NOTE — Telephone Encounter (Signed)
Patient calling back about symbicort.  Also, requesting results of ono.  [phone]

## 2013-09-03 NOTE — Telephone Encounter (Signed)
Magda Paganini placed this in MW look at. thanks

## 2013-09-03 NOTE — Telephone Encounter (Signed)
Advised pt that PA's could take up to 72 hours to be approved. I will leave samples at the front desk for pick up. Called [facility] and asked them to fax over PA. Nira Conn states that she will fax this over.

## 2013-09-03 NOTE — Telephone Encounter (Signed)
Make sure we have it and place in my lookat stack to 1/27 and let pt know I'll call him then with results

## 2013-09-04 ENCOUNTER — Telehealth: Payer: Self-pay | Admitting: *Deleted

## 2013-09-04 ENCOUNTER — Encounter: Payer: Self-pay | Admitting: Internal Medicine

## 2013-09-04 DIAGNOSIS — G4734 Idiopathic sleep related nonobstructive alveolar hypoventilation: Secondary | ICD-10-CM | POA: Insufficient documentation

## 2013-09-04 NOTE — Telephone Encounter (Signed)
Let him know his 02 sats were too low and we need to have the study repeated on 2lpm to see if this corrects the problem (I turned it over to [facility] this am)

## 2013-09-04 NOTE — Telephone Encounter (Signed)
Pt advised of results. I do not see an order placed for this so I have done so. Cade Bing, CMA

## 2013-09-04 NOTE — Telephone Encounter (Signed)
LMTCB

## 2013-09-04 NOTE — Telephone Encounter (Signed)
Initiated PA to [employer] for livalo/pitavastatin

## 2013-09-05 ENCOUNTER — Telehealth: Payer: Self-pay | Admitting: Internal Medicine

## 2013-09-05 NOTE — Telephone Encounter (Signed)
Per Kenney Houseman at [employer] will be approved through 07/2014. Will notify pt and nothing further is needed

## 2013-09-06 ENCOUNTER — Telehealth: Payer: Self-pay | Admitting: *Deleted

## 2013-09-06 NOTE — Telephone Encounter (Signed)
I called pt's ins and initiated PA for Ventolin 90 mcg inhaler. I answered all questions and rep I spoke to will forward info for clinical review. We will receive a decision in 24-72 hours.

## 2013-09-13 ENCOUNTER — Telehealth: Payer: Self-pay | Admitting: *Deleted

## 2013-09-13 NOTE — Telephone Encounter (Signed)
Ventolin HFA is approved from 08/11/13-09/07/2014. Pharmacy informed.

## 2013-09-18 ENCOUNTER — Telehealth: Payer: Self-pay | Admitting: Internal Medicine

## 2013-09-18 NOTE — Telephone Encounter (Signed)
Ok to change to [place] 200 2bid

## 2013-09-18 NOTE — Telephone Encounter (Signed)
lmomtcb x1 for pt 

## 2013-09-18 NOTE — Telephone Encounter (Signed)
Called [facility] was advised pt symbicort needs a PA. Pt must 1st try and fail advair and dulera. Please advise MW thanks

## 2013-09-19 MED ORDER — MOMETASONE FURO-FORMOTEROL FUM 200-5 MCG/ACT IN AERO: 2.0000 | INHALATION_SPRAY | Freq: Two times a day (BID) | RESPIRATORY_TRACT | Status: DC

## 2013-09-19 NOTE — Telephone Encounter (Signed)
Patient returning call.

## 2013-09-19 NOTE — Telephone Encounter (Signed)
Called and spoke w/ pt. Aware of recs. RX sent. Nothing further needed

## 2013-10-02 ENCOUNTER — Other Ambulatory Visit: Payer: BC Managed Care – PPO

## 2013-10-05 ENCOUNTER — Encounter: Payer: Self-pay | Admitting: *Deleted

## 2013-10-18 ENCOUNTER — Encounter: Payer: Self-pay | Admitting: Internal Medicine

## 2013-10-18 ENCOUNTER — Ambulatory Visit (INDEPENDENT_AMBULATORY_CARE_PROVIDER_SITE_OTHER): Payer: BC Managed Care – PPO | Admitting: Internal Medicine

## 2013-10-18 VITALS — BP 134/78 | HR 76 | Temp 98.5°F | Resp 16 | Wt 213.0 lb

## 2013-10-18 DIAGNOSIS — J440 Chronic obstructive pulmonary disease with acute lower respiratory infection: Secondary | ICD-10-CM

## 2013-10-18 DIAGNOSIS — J449 Chronic obstructive pulmonary disease, unspecified: Secondary | ICD-10-CM

## 2013-10-18 MED ORDER — METHYLPREDNISOLONE ACETATE 80 MG/ML IJ SUSP
80.0000 mg | Freq: Once | INTRAMUSCULAR | Status: AC
  Administered 2013-10-18: 80 mg via INTRAMUSCULAR

## 2013-10-18 MED ORDER — ALPRAZOLAM 1 MG PO TABS: ORAL_TABLET | ORAL | Status: DC

## 2013-10-18 MED ORDER — PANTOPRAZOLE SODIUM 40 MG PO TBEC: 40.0000 mg | DELAYED_RELEASE_TABLET | Freq: Every day | ORAL | Status: AC

## 2013-10-18 MED ORDER — TRIAMTERENE-HCTZ 37.5-25 MG PO TABS: 1.0000 | ORAL_TABLET | Freq: Every day | ORAL | Status: DC

## 2013-10-18 MED ORDER — AZITHROMYCIN 250 MG PO TABS: ORAL_TABLET | ORAL | Status: DC

## 2013-10-18 NOTE — Assessment & Plan Note (Signed)
z pac

## 2013-10-18 NOTE — Assessment & Plan Note (Signed)
Depo-medrol 80 mg IM 

## 2013-10-18 NOTE — Progress Notes (Signed)
Pre visit review using our clinic review tool, if applicable. No additional management support is needed unless otherwise documented below in the visit note. 

## 2013-10-20 NOTE — Progress Notes (Signed)
   Subjective:    HPI F/u  for SOB...  The patient presents for a follow-up of  chronic hypertension, chronic dyslipidemia, anxiety, COPD, HTN not controlled with medicines. He states BP is nl at home - he did not take Maxzide. He  quit smoking 1 wk ago   He ran out of BP meds 3 d ago  BP Readings from Last 3 Encounters:  10/18/13 134/78  08/27/13 130/80  08/24/13 130/72   Wt Readings from Last 3 Encounters:  10/18/13 213 lb (96.616 kg)  08/24/13 218 lb (98.884 kg)  08/06/13 229 lb (103.874 kg)      Review of Systems  Constitutional: Negative for fever, appetite change, fatigue and unexpected weight change.  HENT: Negative for congestion, nosebleeds, sneezing, sore throat and trouble swallowing.   Eyes: Negative for itching and visual disturbance.  Respiratory: Positive for cough.   Cardiovascular: Negative for chest pain, palpitations and leg swelling.  Gastrointestinal: Negative for nausea, diarrhea, blood in stool and abdominal distention.  Genitourinary: Negative for frequency and hematuria.  Musculoskeletal: Negative for back pain, gait problem, joint swelling and neck pain.  Skin: Negative for rash.  Neurological: Negative for dizziness, tremors, speech difficulty and weakness.  Psychiatric/Behavioral: Negative for sleep disturbance, dysphoric mood and agitation. The patient is nervous/anxious.        Objective:   Physical Exam  Constitutional: He is oriented to person, place, and time. He appears well-developed.  HENT:  Mouth/Throat: Oropharynx is clear and moist.  Eyes: Conjunctivae are normal. Pupils are equal, round, and reactive to light.  Neck: Normal range of motion. No JVD present. No thyromegaly present.  Cardiovascular: Normal rate, regular rhythm, normal heart sounds and intact distal pulses.  Exam reveals no gallop and no friction rub.   No murmur heard. Pulmonary/Chest: Effort normal and breath sounds normal. No respiratory distress. He has no  wheezes. He has no rales. He exhibits no tenderness.  Abdominal: Soft. Bowel sounds are normal. He exhibits no distension and no mass. There is no tenderness. There is no rebound and no guarding.  Musculoskeletal: Normal range of motion. He exhibits no edema and no tenderness.  Lymphadenopathy:    He has no cervical adenopathy.  Neurological: He is alert and oriented to person, place, and time. He has normal reflexes. No cranial nerve deficit. He exhibits normal muscle tone. Coordination normal.  Skin: Skin is warm and dry. No rash noted.  Psychiatric: He has a normal mood and affect. His behavior is normal. Judgment and thought content normal.   Lab Results  Component Value Date   WBC 11.9* 08/06/2013   HGB 18.3* 08/06/2013   HCT 54.4* 08/06/2013   PLT 252 08/06/2013   GLUCOSE 209* 07/30/2013   CHOL 191 06/22/2013   TRIG 58.0 06/22/2013   HDL 41.00 06/22/2013   LDLDIRECT 149.0 02/11/2010   LDLCALC 138* 06/22/2013   ALT 18 06/22/2013   AST 14 06/22/2013   NA 139 07/30/2013   K 4.0 07/30/2013   CL 96 07/30/2013   CREATININE 0.8 07/30/2013   BUN 15 07/30/2013   CO2 36* 07/30/2013   TSH 1.06 06/22/2013   PSA 1.37 06/22/2013   INR 0.93 08/06/2013        Assessment & Plan:

## 2013-11-12 ENCOUNTER — Emergency Department (HOSPITAL_COMMUNITY)
Admission: EM | Admit: 2013-11-12 | Discharge: 2013-11-13 | Disposition: A | Discharge status: Home / Self Care | Payer: BC Managed Care – PPO | Attending: Emergency Medicine | Admitting: Emergency Medicine

## 2013-11-12 ENCOUNTER — Encounter (HOSPITAL_COMMUNITY): Payer: Self-pay | Admitting: Emergency Medicine

## 2013-11-12 ENCOUNTER — Emergency Department (HOSPITAL_COMMUNITY): Payer: BC Managed Care – PPO

## 2013-11-12 ENCOUNTER — Telehealth: Payer: Self-pay | Admitting: Internal Medicine

## 2013-11-12 DIAGNOSIS — IMO0002 Reserved for concepts with insufficient information to code with codable children: Secondary | ICD-10-CM | POA: Insufficient documentation

## 2013-11-12 DIAGNOSIS — R0989 Other specified symptoms and signs involving the circulatory and respiratory systems: Secondary | ICD-10-CM | POA: Insufficient documentation

## 2013-11-12 DIAGNOSIS — Z79899 Other long term (current) drug therapy: Secondary | ICD-10-CM | POA: Insufficient documentation

## 2013-11-12 DIAGNOSIS — K219 Gastro-esophageal reflux disease without esophagitis: Secondary | ICD-10-CM | POA: Insufficient documentation

## 2013-11-12 DIAGNOSIS — Z8719 Personal history of other diseases of the digestive system: Secondary | ICD-10-CM | POA: Insufficient documentation

## 2013-11-12 DIAGNOSIS — J159 Unspecified bacterial pneumonia: Secondary | ICD-10-CM | POA: Insufficient documentation

## 2013-11-12 DIAGNOSIS — I10 Essential (primary) hypertension: Secondary | ICD-10-CM | POA: Insufficient documentation

## 2013-11-12 DIAGNOSIS — J189 Pneumonia, unspecified organism: Secondary | ICD-10-CM

## 2013-11-12 DIAGNOSIS — G8929 Other chronic pain: Secondary | ICD-10-CM | POA: Insufficient documentation

## 2013-11-12 DIAGNOSIS — F411 Generalized anxiety disorder: Secondary | ICD-10-CM | POA: Insufficient documentation

## 2013-11-12 DIAGNOSIS — Z9981 Dependence on supplemental oxygen: Secondary | ICD-10-CM | POA: Insufficient documentation

## 2013-11-12 DIAGNOSIS — R0602 Shortness of breath: Secondary | ICD-10-CM

## 2013-11-12 DIAGNOSIS — Z8739 Personal history of other diseases of the musculoskeletal system and connective tissue: Secondary | ICD-10-CM | POA: Insufficient documentation

## 2013-11-12 DIAGNOSIS — F17200 Nicotine dependence, unspecified, uncomplicated: Secondary | ICD-10-CM | POA: Insufficient documentation

## 2013-11-12 DIAGNOSIS — J441 Chronic obstructive pulmonary disease with (acute) exacerbation: Secondary | ICD-10-CM | POA: Insufficient documentation

## 2013-11-12 DIAGNOSIS — Z862 Personal history of diseases of the blood and blood-forming organs and certain disorders involving the immune mechanism: Secondary | ICD-10-CM | POA: Insufficient documentation

## 2013-11-12 DIAGNOSIS — R9389 Abnormal findings on diagnostic imaging of other specified body structures: Secondary | ICD-10-CM

## 2013-11-12 DIAGNOSIS — Z8639 Personal history of other endocrine, nutritional and metabolic disease: Secondary | ICD-10-CM | POA: Insufficient documentation

## 2013-11-12 LAB — CBC WITH DIFFERENTIAL/PLATELET
Basophils Absolute: 0 10*3/uL (ref 0.0–0.1)
Basophils Relative: 0 % (ref 0–1)
Eosinophils Absolute: 0.1 10*3/uL (ref 0.0–0.7)
Eosinophils Relative: 1 % (ref 0–5)
HEMATOCRIT: 39.9 % (ref 39.0–52.0)
Hemoglobin: 13.3 g/dL (ref 13.0–17.0)
Lymphocytes Relative: 12 % (ref 12–46)
Lymphs Abs: 1.2 10*3/uL (ref 0.7–4.0)
MCH: 30.8 pg (ref 26.0–34.0)
MCHC: 33.3 g/dL (ref 30.0–36.0)
MCV: 92.4 fL (ref 78.0–100.0)
MONO ABS: 1.1 10*3/uL — AB (ref 0.1–1.0)
Monocytes Relative: 12 % (ref 3–12)
NEUTROS ABS: 7 10*3/uL (ref 1.7–7.7)
NEUTROS PCT: 75 % (ref 43–77)
Platelets: 212 10*3/uL (ref 150–400)
RBC: 4.32 MIL/uL (ref 4.22–5.81)
RDW: 14.5 % (ref 11.5–15.5)
WBC: 9.4 10*3/uL (ref 4.0–10.5)

## 2013-11-12 LAB — COMPREHENSIVE METABOLIC PANEL
ALBUMIN: 3.6 g/dL (ref 3.5–5.2)
ALT: 10 U/L (ref 0–53)
AST: 13 U/L (ref 0–37)
Alkaline Phosphatase: 76 U/L (ref 39–117)
BILIRUBIN TOTAL: 0.5 mg/dL (ref 0.3–1.2)
BUN: 18 mg/dL (ref 6–23)
CHLORIDE: 97 meq/L (ref 96–112)
CO2: 28 mEq/L (ref 19–32)
Calcium: 8.9 mg/dL (ref 8.4–10.5)
Creatinine, Ser: 0.86 mg/dL (ref 0.50–1.35)
GFR calc Af Amer: >90 mL/min (ref >90)
GFR calc non Af Amer: >90 mL/min (ref >90)
Glucose, Bld: 171 mg/dL — ABNORMAL HIGH (ref 70–99)
Potassium: 4.3 mEq/L (ref 3.7–5.3)
Sodium: 140 mEq/L (ref 137–147)
Total Protein: 7.2 g/dL (ref 6.0–8.3)

## 2013-11-12 MED ORDER — ALBUTEROL SULFATE (2.5 MG/3ML) 0.083% IN NEBU
5.0000 mg | INHALATION_SOLUTION | Freq: Once | RESPIRATORY_TRACT | Status: AC
  Administered 2013-11-12: 5 mg via RESPIRATORY_TRACT
  Filled 2013-11-12: qty 6

## 2013-11-12 MED ORDER — IPRATROPIUM BROMIDE 0.02 % IN SOLN
0.5000 mg | Freq: Once | RESPIRATORY_TRACT | Status: AC
  Administered 2013-11-12: 0.5 mg via RESPIRATORY_TRACT
  Filled 2013-11-12: qty 2.5

## 2013-11-12 MED ORDER — SODIUM CHLORIDE 0.9 % IV BOLUS (SEPSIS)
1000.0000 mL | Freq: Once | INTRAVENOUS | Status: AC
  Administered 2013-11-12: 1000 mL via INTRAVENOUS

## 2013-11-12 MED ORDER — LEVOFLOXACIN 750 MG PO TABS: 750.0000 mg | ORAL_TABLET | Freq: Every day | ORAL | Status: DC

## 2013-11-12 MED ORDER — IOHEXOL 300 MG/ML  SOLN
75.0000 mL | Freq: Once | INTRAMUSCULAR | Status: AC | PRN
  Administered 2013-11-12: 75 mL via INTRAVENOUS

## 2013-11-12 MED ORDER — LEVOFLOXACIN IN D5W 750 MG/150ML IV SOLN
750.0000 mg | INTRAVENOUS | Status: DC
Start: 2013-11-12 — End: 2013-11-13
  Administered 2013-11-12: 750 mg via INTRAVENOUS
  Filled 2013-11-12: qty 150

## 2013-11-12 MED ORDER — PREDNISONE 10 MG PO TABS: ORAL_TABLET | ORAL | Status: DC

## 2013-11-12 NOTE — Telephone Encounter (Signed)
Called and spoke with pt and he stated that he cut the grass a couple of days ago and forgot to wear a mask.  Pt is not c/o cough with clear to light green mucus.  Drainage that is clear.  Wheezing at times.  Coughing spells that is causing him to have increase in SOB at times. Pt stated that he did try an otc allergy med but this did not help.  Pt is requesting further recs and MW is out of the office this week. RA please advise. Thanks  Allergies  Allergen Reactions  . Azor [Amlodipine-Olmesartan]     Dizzy   . Codeine     REACTION: itching  . Crestor [Rosuvastatin]     cramps  . Lipitor [Atorvastatin]     cramps  . Lovastatin     cramps  . Pravachol [Pravastatin Sodium]     achy     Current Outpatient Prescriptions on File Prior to Visit  Medication Sig Dispense Refill  . Aclidinium Bromide (TUDORZA PRESSAIR) 400 MCG/ACT AEPB Inhale 1 puff into the lungs 2 (two) times daily. One twice daily  1 each  11  . albuterol (PROVENTIL HFA;VENTOLIN HFA) 108 (90 BASE) MCG/ACT inhaler Inhale 2 puffs into the lungs every 6 (six) hours as needed for wheezing.  1 Inhaler  3  . ALPRAZolam (XANAX) 1 MG tablet TAKE 1 TABLET BY MOUTH 3 TIMES A DAY AS NEEDED  270 tablet  1  . azithromycin (ZITHROMAX) 250 MG tablet As directed  6 tablet  0  . buprenorphine-naloxone (SUBOXONE) 2-0.5 MG SUBL Place 1 tablet under the tongue daily.       . mometasone-formoterol (DULERA) 200-5 MCG/ACT AERO Inhale 2 puffs into the lungs 2 (two) times daily.  1 Inhaler  3  . pantoprazole (PROTONIX) 40 MG tablet Take 1 tablet (40 mg total) by mouth daily.  90 tablet  3  . Pitavastatin Calcium 4 MG TABS Take 1 tablet (4 mg total) by mouth daily.  30 tablet  2  . triamterene-hydrochlorothiazide (MAXZIDE-25) 37.5-25 MG per tablet Take 1 tablet by mouth daily.  90 tablet  3  . varenicline (CHANTIX CONTINUING MONTH PAK) 1 MG tablet Take 1 tablet (1 mg total) by mouth 2 (two) times daily.  60 tablet  1   No current  facility-administered medications on file prior to visit.

## 2013-11-12 NOTE — Discharge Instructions (Signed)
Take your proair inhaler every 3-4 hrs. Take levaquin daily. Prednisone as prescribed. Ibuprofen and tylenol around the clock to keep fever down. Drink plenty of fluids. Call your pulmonologist tomorrow to get seen this week. They should be able to look at your chest CT scan.    Pneumonia, Adult Pneumonia is an infection of the lungs.  CAUSES Pneumonia may be caused by bacteria or a virus. Usually, these infections are caused by breathing infectious particles into the lungs (respiratory tract). SYMPTOMS   Cough.  Fever.  Chest pain.  Increased rate of breathing.  Wheezing.  Mucus production. DIAGNOSIS  If you have the common symptoms of pneumonia, your caregiver will typically confirm the diagnosis with a chest X-ray. The X-ray will show an abnormality in the lung (pulmonary infiltrate) if you have pneumonia. Other tests of your blood, urine, or sputum may be done to find the specific cause of your pneumonia. Your caregiver may also do tests (blood gases or pulse oximetry) to see how well your lungs are working. TREATMENT  Some forms of pneumonia may be spread to other people when you cough or sneeze. You may be asked to wear a mask before and during your exam. Pneumonia that is caused by bacteria is treated with antibiotic medicine. Pneumonia that is caused by the influenza virus may be treated with an antiviral medicine. Most other viral infections must run their course. These infections will not respond to antibiotics.  PREVENTION A pneumococcal shot (vaccine) is available to prevent a common bacterial cause of pneumonia. This is usually suggested for:  People over 26 years old.  Patients on chemotherapy.  People with chronic lung problems, such as bronchitis or emphysema.  People with immune system problems. If you are over 65 or have a high risk condition, you may receive the pneumococcal vaccine if you have not received it before. In some countries, a routine influenza vaccine  is also recommended. This vaccine can help prevent some cases of pneumonia.You may be offered the influenza vaccine as part of your care. If you smoke, it is time to quit. You may receive instructions on how to stop smoking. Your caregiver can provide medicines and counseling to help you quit. HOME CARE INSTRUCTIONS   Cough suppressants may be used if you are losing too much rest. However, coughing protects you by clearing your lungs. You should avoid using cough suppressants if you can.  Your caregiver may have prescribed medicine if he or she thinks your pneumonia is caused by a bacteria or influenza. Finish your medicine even if you start to feel better.  Your caregiver may also prescribe an expectorant. This loosens the mucus to be coughed up.  Only take over-the-counter or prescription medicines for pain, discomfort, or fever as directed by your caregiver.  Do not smoke. Smoking is a common cause of bronchitis and can contribute to pneumonia. If you are a smoker and continue to smoke, your cough may last several weeks after your pneumonia has cleared.  A cold steam vaporizer or humidifier in your room or home may help loosen mucus.  Coughing is often worse at night. Sleeping in a semi-upright position in a recliner or using a couple pillows under your head will help with this.  Get rest as you feel it is needed. Your body will usually let you know when you need to rest. SEEK IMMEDIATE MEDICAL CARE IF:   Your illness becomes worse. This is especially true if you are elderly or weakened from any  other disease.  You cannot control your cough with suppressants and are losing sleep.  You begin coughing up blood.  You develop pain which is getting worse or is uncontrolled with medicines.  You have a fever.  Any of the symptoms which initially brought you in for treatment are getting worse rather than better.  You develop shortness of breath or chest pain. MAKE SURE YOU:    Understand these instructions.  Will watch your condition.  Will get help right away if you are not doing well or get worse. Document Released: 07/26/2005 Document Revised: 10/18/2011 Document Reviewed: 10/15/2010 [facility] Patient Information 2014 [place], [place].

## 2013-11-12 NOTE — Telephone Encounter (Signed)
lmomtcb x1 

## 2013-11-12 NOTE — Telephone Encounter (Signed)
Pt returned call

## 2013-11-12 NOTE — ED Provider Notes (Signed)
CSN: [id number]     Arrival date & time 11/12/13  1934 History   First MD Initiated Contact with Patient 11/12/13 1939     Chief Complaint  Patient presents with  . Shortness of Breath     (Consider location/radiation/quality/duration/timing/severity/associated sxs/prior Treatment) HPI Alex Hart is a 58 y.o. male history of COPD, on home oxygen at nighttime only, comes in complaining of cough, shortness of breath, fever up to 1 or 2. He states his symptoms began yesterday. Has associated suppurative nasal congestion, body aches and chills. He was seen in urgent care prior to coming in here and was told he has pneumonia. He was given a shot of "some medicine", and Tylenol 1 g, and ibuprofen 600 mg for fever 102. He was also given a albuterol treatment on the way here. Patient states at present he is not short of breath, he continues to complain of a cough. He denies any chest pain. No prior cardiac problems.   Past Medical History  Diagnosis Date  . Muscle spasm of back   . Blood in stool   . Anxiety   . Arthritis   . Chronic pain syndrome   . Hx of tonsillectomy   . Bronchitis, chronic   . Hypertension   . Hyperlipidemia   . GERD (gastroesophageal reflux disease)   . COPD (chronic obstructive pulmonary disease)    Past Surgical History  Procedure Laterality Date  . Tonsillectomy    . Inguinal hernia repair  1990  . Back surgery  1992  . Abdominal surgery to remove adhesions from colon      teenager   Family History  Problem Relation Age of Onset  . Cancer Mother   . Stroke Father   . Asthma Sister   . Emphysema Sister    History  Substance Use Topics  . Smoking status: Current Every Day Smoker -- 0.50 packs/day for 40 years    Types: Cigarettes  . Smokeless tobacco: Never Used  . Alcohol Use: No    Review of Systems  Constitutional: Positive for fever and chills.  HENT: Positive for congestion and sore throat.   Respiratory: Positive for cough, shortness of  breath and wheezing. Negative for chest tightness.   Cardiovascular: Negative for chest pain, palpitations and leg swelling.  Gastrointestinal: Negative for nausea, vomiting, abdominal pain, diarrhea and abdominal distention.  Genitourinary: Negative for dysuria, urgency, frequency and hematuria.  Musculoskeletal: Negative for arthralgias, myalgias, neck pain and neck stiffness.  Skin: Negative for rash.  Allergic/Immunologic: Negative for immunocompromised state.  Neurological: Negative for dizziness, weakness, light-headedness, numbness and headaches.      Allergies  Azor; Codeine; Crestor; Lipitor; Lovastatin; and Pravachol  Home Medications   Current Outpatient Rx  Name  Route  Sig  Dispense  Refill  . Aclidinium Bromide (TUDORZA PRESSAIR) 400 MCG/ACT AEPB   Inhalation   Inhale 1 puff into the lungs 2 (two) times daily. One twice daily   1 each   11   . albuterol (PROVENTIL HFA;VENTOLIN HFA) 108 (90 BASE) MCG/ACT inhaler   Inhalation   Inhale 2 puffs into the lungs every 6 (six) hours as needed for wheezing.   1 Inhaler   3   . buprenorphine-naloxone (SUBOXONE) 2-0.5 MG SUBL   Sublingual   Place 1 tablet under the tongue daily.          . mometasone-formoterol (DULERA) 200-5 MCG/ACT AERO   Inhalation   Inhale 2 puffs into the lungs 2 (two) times daily.  1 Inhaler   3   . pantoprazole (PROTONIX) 40 MG tablet   Oral   Take 1 tablet (40 mg total) by mouth daily.   90 tablet   3   . triamterene-hydrochlorothiazide (MAXZIDE-25) 37.5-25 MG per tablet   Oral   Take 1 tablet by mouth daily.   90 tablet   3    BP 118/55  Pulse 94  Temp(Src) 100.8 F (38.2 C) (Oral)  Resp 16  Ht 5\' 11"  (1.803 m)  Wt 215 lb (97.523 kg)  BMI 30.00 kg/m2  SpO2 94% Physical Exam  Nursing note and vitals reviewed. Constitutional: He is oriented to person, place, and time. He appears well-developed and well-nourished. No distress.  HENT:  Head: Normocephalic and atraumatic.   Right Ear: External ear normal.  Left Ear: External ear normal.  Nose: Nose normal.  Mouth/Throat: Oropharynx is clear and moist.  Eyes: Conjunctivae are normal.  Neck: Normal range of motion. Neck supple.  Cardiovascular: Normal rate, regular rhythm and normal heart sounds.   Pulmonary/Chest: Effort normal. No respiratory distress. He has no wheezes. He has no rales.  Decreased air movement bilaterally  Abdominal: Soft. Bowel sounds are normal. He exhibits no distension. There is no tenderness. There is no rebound.  Musculoskeletal: He exhibits no edema.  Neurological: He is alert and oriented to person, place, and time.  Skin: Skin is warm and dry.    ED Course  Procedures (including critical care time) Labs Review Labs Reviewed  CBC WITH DIFFERENTIAL - Abnormal; Notable for the following:    Monocytes Absolute 1.1 (*)    All other components within normal limits  COMPREHENSIVE METABOLIC PANEL - Abnormal; Notable for the following:    Glucose, Bld 171 (*)    All other components within normal limits   Imaging Review Dg Chest 2 View  11/12/2013   CLINICAL DATA:  Cough and congestion.  History of smoking.  EXAM: CHEST  2 VIEW  COMPARISON:  Chest radiograph performed 06/27/2013  FINDINGS: There is new 4.4 cm masslike density at the superior aspect of the left hilum. This raises concern for malignancy. Underlying vascular congestion is again noted. Chronically increased interstitial markings are seen. No definite pleural effusion or pneumothorax is identified.  The cardiomediastinal silhouette remains normal in size. No acute osseous abnormalities are seen. Cervical spinal fusion hardware is partially imaged.  IMPRESSION: 1. New 4.4 cm masslike density at the superior aspect of the left hilum. This raises concern for malignancy. CT of the chest would be helpful for further evaluation, when and as deemed clinically appropriate. 2. Underlying vascular congestion and chronic lung changes  again noted.   Electronically Signed   By: Garald Balding M.D.   On: 11/12/2013 21:35   Ct Chest W Contrast  11/12/2013   CLINICAL DATA:  Shortness of breath, mass seen on prior radiograph.  EXAM: CT CHEST WITH CONTRAST  TECHNIQUE: Multidetector CT imaging of the chest was performed during intravenous contrast administration.  CONTRAST:  30mL OMNIPAQUE IOHEXOL 300 MG/ML  SOLN  COMPARISON:  DG CHEST 2 VIEW dated 11/12/2013; DG CHEST 2 VIEW dated 06/27/2013; CT-CHEST dated 01/05/2007  FINDINGS: Bulky mediastinal lymphadenopathy with left aortopulmonary window. 4 x 8.7 cm nodal conglomeration extending into the left upper lobe, which appears to correspond to radiographic abnormality. Additional 2.6 cm subpleural left upper lobe pulmonary nodule. Pretracheal 3.4 x 4.1 cm no conglomeration, There additional tree-in-bud airspace opacities in the lungs by a laterally with patchy ground-glass opacities extending towards  the hilum. Minimal patchy tree-in-bud opacities in the lingula. Scarring in right lower lobe, lateral segment is unchanged. No pleural effusions.  Heart size is normal, Coronary artery calcifications present. Mild pericardial thickening. Thoracic aorta is normal course and caliber with mild calcific atherosclerosis.  No axillary lymphadenopathy. Tracheobronchial tree is patent comminuted pneumothorax.  Included view of the abdomen demonstrates 3.6 cm infrarenal aortic aneurysm with peripheral calcific atherosclerosis. The adrenal glands and visualized solid organs are unremarkable.  IMPRESSION: Bulky mediastinal lymphadenopathy including 4 x 8.7 cm left aortopulmonary window nodal conglomeration corresponding to radiographic abnormality. In addition, 2.6 cm left upper lobe subpleural pulmonary nodule with patchy areas of tree-in-bud infiltrate throughout the lungs. Constellation of findings are concerning for neoplasm (lymphoma or possibly primary bronchogenic carcinoma with nodal metastasis), additional  consideration may be Sarcoidosis. Scattered areas of tree-in-bud appearance may reflect infectious process though, can reflect lymphangitic spread of tumor. Recommend clinical correlation and histopathologic correlation.   Electronically Signed   By: Elon Alas   On: 11/12/2013 23:15     EKG Interpretation None      MDM   Final diagnoses:  Shortness of breath  CAP (community acquired pneumonia)  Abnormal chest CT    Pt with SOB, fever, cough for 2 days. Pt  Is febrile 102.9, just got tylenol 1g and ibuprofen 600mg . Will get labs, fluids,  CXR.   I have ordered pt levaquin 750mg  IV. His temperature is coming down. He is feeling much better. He is receiving nebs in ED.    11:38 PM CT chest came back as described above. It is showing malignancy vs infectious process. I discussed results with dr. Dorna Mai and with pt. I have explained to him that there is a possibility of this being cancer. Pt is followed by pulmonology, Dr. Melvyn Novas. I have offered pt admission vs outpatient follow up. Pt chose to leave to go home. He is feeling much better at this time. I have told him to call his pulmonologist tomorrow, and follow up with them this week. Return if worsening symptoms. Home with prednisone taper, levaquin.   Filed Vitals:   11/12/13 2058 11/12/13 2330 11/13/13 0000 11/13/13 0015  BP: 110/57 129/60 104/65 120/61  Pulse: 89 92 89 82  Temp: 99.4 F (37.4 C)     TempSrc: Oral     Resp: 26     Height:      Weight:      SpO2: 93% 93% 91% 91%     Florene Route Farrie Sann, PA-C 11/13/13 0053

## 2013-11-12 NOTE — ED Notes (Signed)
Per EMS- pt from urgent care. Temp 102.9 Pt had 1000 tylenol 600 motrin before leaving. Dec appetite, weakness and shortness of breath. Denies SOB at present. Diminished breath sounds.

## 2013-11-12 NOTE — ED Notes (Signed)
Pt states he has been feeling short of breath for two days. History of COPD. C/o fever and chills, temp 102.9 at urgent care. Has had tylenol and motrin prior to arrival. Bilateral breath sounds diminished and with rhonchi. Dry cough, productive at times with copious amount of yellow to clear mucous.

## 2013-11-12 NOTE — Telephone Encounter (Signed)
Pt is aware of RA's recs. Nothing further was needed.

## 2013-11-12 NOTE — Telephone Encounter (Signed)
Zyrtec daily mucinex prn Call for green phlegm

## 2013-11-13 ENCOUNTER — Telehealth: Payer: Self-pay | Admitting: *Deleted

## 2013-11-13 ENCOUNTER — Telehealth: Payer: Self-pay | Admitting: Internal Medicine

## 2013-11-13 NOTE — Telephone Encounter (Signed)
Pt presented on 11/12/13 with SOB, Pulse O2 of 85%. Dr. Owens Shark just informing PCP of CXR findings revealing possible malignancy. He is faxing over report to [phone] for MD review. See 11/13/13 phone note re: recent [place] visit.

## 2013-11-13 NOTE — Telephone Encounter (Signed)
Per 4.6.15 ER note: MDM    Final diagnoses:   Shortness of breath   CAP (community acquired pneumonia)   Abnormal chest CT   Pt with SOB, fever, cough for 2 days. Pt Is febrile 102.9, just got tylenol 1g and ibuprofen 600mg . Will get labs, fluids, CXR.  I have ordered pt levaquin 750mg  IV. His temperature is coming down. He is feeling much better. He is receiving nebs in ED.  11:38 PM  CT chest came back as described above. It is showing malignancy vs infectious process. I discussed results with dr. Dorna Mai and with pt. I have explained to him that there is a possibility of this being cancer. Pt is followed by pulmonology, Dr. Melvyn Novas. I have offered pt admission vs outpatient follow up. Pt chose to leave to go home. He is feeling much better at this time. I have told him to call his pulmonologist tomorrow, and follow up with them this week. Return if worsening symptoms. Home with prednisone taper, levaquin.   Called spoke with patient and informed him that the ER physician did recommend that he follow up with pulmonary this week.  MW out of the office until 4.10.15, with no openings that day.  Appt scheduled with TP for 4.3.15 @ 2:30pm.  Pt okay with this date/time.  Nothing further needed; will sign off.

## 2013-11-15 ENCOUNTER — Ambulatory Visit (INDEPENDENT_AMBULATORY_CARE_PROVIDER_SITE_OTHER): Payer: BC Managed Care – PPO | Admitting: Adult Health

## 2013-11-15 ENCOUNTER — Encounter: Payer: Self-pay | Admitting: Adult Health

## 2013-11-15 VITALS — BP 130/74 | HR 86 | Temp 98.3°F | Ht 71.0 in | Wt 210.0 lb

## 2013-11-15 DIAGNOSIS — J449 Chronic obstructive pulmonary disease, unspecified: Secondary | ICD-10-CM

## 2013-11-15 NOTE — ED Provider Notes (Signed)
Medical screening examination/treatment/procedure(s) were performed by non-physician practitioner and as supervising physician I was immediately available for consultation/collaboration.   EKG Interpretation   Date/Time:  Monday November 12 2013 20:19:31 EDT Ventricular Rate:  90 PR Interval:  149 QRS Duration: 104 QT Interval:  350 QTC Calculation: 428 R Axis:   79 Text Interpretation:  Sinus rhythm Baseline wander in lead(s) I II III aVR  aVF Poor data quality Reconfirmed by [facility]  MD, MICHEAL ([id number]) on 11/15/2013  2:45:48 PM        Saddie Benders. Aayansh Codispoti, MD 11/15/13 1446

## 2013-11-15 NOTE — Telephone Encounter (Signed)
Noted. Thx.

## 2013-11-15 NOTE — Progress Notes (Signed)
Subjective:    Patient ID: Alex Hart, male    DOB: April 19, 1956   MRN: [id number] Brief patient profile:  26 yowm smoker with doe x 2009 worse 2013 referred 10/30/2012 to pulmonary clinic by Dr Alain Marion  History of Present Illness  10/30/2012 1st pulmonary eval still smoking  With doe x pushing a mower, rushing when walking dog, otherwise has just learned to pace himself. rec Plan A = automatic = symbicort 160 Take 2 puffs first thing in am and then another 2 puffs about 12 hours later.  Only use your albuterol as a rescue medication   12/22/2012 f/u ov/[place] re GOLD III COPD/ still smoking Chief Complaint  Patient presents with  . Follow-up    Pt states breathing is unchanged since the last visit and denies any new co's.   If you stop smokiing now, your lung function should level off where it is - this is the most important aspect of your care Continue symbicort 160 Take 2 puffs first thing in am and then another 2 puffs about 12 hours later.  Only use your combivent as a rescue medication    06/27/2013 f/u ov/[place] re: COPD GOLD III/ still smoking  Chief Complaint  Patient presents with  . Follow-up    Pt c/o SOB with any exertion, postnasal drainage, sometimes prod cough with yellow mucous X33m.  Pt states he is overusing rescue inhaler, believes it is is due to him panicking.  For months using more than twice daily combivent  Stop combivent While on prednisone double your fluid/blood pressure pill to offset fluid retention Change the protonix to 40 mg Take 30- 60 min before your first and last meals of the day until breathing/ coughing better then just one before bfast daily  Augmentin 875 mg take one pill twice daily  X 10 days - take at breakfast and supper with large glass of water.  It would help reduce the usual side effects (diarrhea and yeast infections) if you ate cultured yogurt at lunch.  Prednisone 10 mg take  4 each am x 2 days,   2 each am x 2 days,  1 each am x 2 days  and stop Plan A = automatic= symbicort 160 Take 2 puffs first thing in am and then another 2 puffs about 12 hours later.  And tudorza one twice daily Plan B= Backup= proair (albuterol) ok up to 2 puffs every 4 hours but if you need it more than twice daily you need to return here  Plan C = nebulizer (hold off for now and work on maintaining off cigarettes) Plan D = Doctor, call if needed Plan E = ER, if all else fails Monitor your blood pressure carefully at home and call Dr Alain Marion if over 140/85   08/27/2013 f/u ov/[place] re: gold III,  Still smoking complicated  By mod PAH/ cor pulmonale Chief Complaint  Patient presents with  . Follow-up    Pt states that his breathing is doing well and denies any new co's today.  using proair maybe twice daily at most - never neb, much better vs before tudorza >>  11/15/2013 Post ER l follow up  Was seen in ER on 4/6/ for cough , congestion and fever-102 x 5 days. In ER cxr showed 4.4 cm masslike density in left hilum. Subsequent CT showed 4x 8.7 cm left nodal conglomeration and 2.6 cm lul nodule. W/ patchy tree in bud infitrate throughout lungs. Bulky medistinal lymphadenopathy.  He was started  on Levaquin  And prednisone  He is feeling some better but still weak No weight loss or hemoptysis.  CBC was nml    Current Medications, Allergies, Past Medical History, Past Surgical History, Family History, and Social History were reviewed in [employer] record.  ROS  The following are not active complaints unless bolded sore throat, dysphagia, dental problems, itching, sneezing,  nasal congestion or excess/ purulent secretions, ear ache,   fever, chills, sweats, unintended wt loss, pleuritic or exertional cp, hemoptysis,  orthopnea pnd or leg swelling, presyncope, palpitations, heartburn, abdominal pain, anorexia, nausea, vomiting, diarrhea  or change in bowel or urinary habits, change in stools or urine, dysuria,hematuria,  rash,  arthralgias, visual complaints, headache, numbness weakness or ataxia or problems with walking or coordination,  change in mood/affect or memory.                  Objective:   Physical Exam  06/27/2013     230  > 08/27/2013  216  >210 11/15/2013  HEENT mild turbinate edema.  Oropharynx no thrush or excess pnd or cobblestoning.  No JVD or cervical adenopathy. Mild accessory muscle hypertrophy. Trachea midline, nl thryroid. Chest was hyperinflated by percussion with  Few rhonchi . Regular rate and rhythm without murmur gallop or rub or increase P2 or edema.  Abd: no hsm, nl excursion. Ext warm without cyanosis or clubbing.      CXR  06/27/2013 :  Stable central pulmonary vascular congestion compared to prior exam. No other significant abnormality seen    11/12/13 >New 4.4 cm masslike density at the superior aspect of the left  hilum. This raises concern for malignancy.  CT chest 11/12/13 >Bulky mediastinal lymphadenopathy including 4 x 8.7 cm left  aortopulmonary window nodal conglomeration corresponding to  radiographic abnormality. In addition, 2.6 cm left upper lobe  subpleural pulmonary nodule with patchy areas of tree-in-bud  infiltrate throughout the lungs    Assessment & Plan:

## 2013-11-15 NOTE — Patient Instructions (Signed)
Finish Levaquin and Prednisone as directed.  Mucinex DM Twice daily  As needed  Cough/congestion  Fluids and rest  Follow up 1 week with chest xray with Dr. Melvyn Novas  .  Please contact office for sooner follow up if symptoms do not improve or worsen or seek emergency care  Good job on not smoking -keep up good work.

## 2013-11-16 NOTE — Assessment & Plan Note (Addendum)
Flare w/ associated PNA +/- lung mass  Will need close follow up w/ CXR    Plan  Finish Levaquin and Prednisone as directed.  Mucinex DM Twice daily  As needed  Cough/congestion  Fluids and rest  Follow up 1 week with chest xray with Dr. Melvyn Novas  .  Please contact office for sooner follow up if symptoms do not improve or worsen or seek emergency care  Good job on not smoking -keep up good work.

## 2013-11-22 ENCOUNTER — Encounter: Payer: Self-pay | Admitting: *Deleted

## 2013-11-22 ENCOUNTER — Encounter: Payer: Self-pay | Admitting: Internal Medicine

## 2013-11-22 ENCOUNTER — Ambulatory Visit (INDEPENDENT_AMBULATORY_CARE_PROVIDER_SITE_OTHER): Payer: BC Managed Care – PPO | Admitting: Internal Medicine

## 2013-11-22 ENCOUNTER — Ambulatory Visit (INDEPENDENT_AMBULATORY_CARE_PROVIDER_SITE_OTHER)
Admission: RE | Admit: 2013-11-22 | Discharge: 2013-11-22 | Disposition: A | Discharge status: Home / Self Care | Payer: BC Managed Care – PPO | Source: Ambulatory Visit | Attending: Internal Medicine | Admitting: Internal Medicine

## 2013-11-22 VITALS — BP 132/70 | HR 120 | Temp 98.1°F | Ht 71.0 in | Wt 206.0 lb

## 2013-11-22 DIAGNOSIS — J449 Chronic obstructive pulmonary disease, unspecified: Secondary | ICD-10-CM

## 2013-11-22 DIAGNOSIS — R222 Localized swelling, mass and lump, trunk: Secondary | ICD-10-CM

## 2013-11-22 DIAGNOSIS — R918 Other nonspecific abnormal finding of lung field: Secondary | ICD-10-CM | POA: Insufficient documentation

## 2013-11-22 DIAGNOSIS — R0902 Hypoxemia: Secondary | ICD-10-CM

## 2013-11-22 DIAGNOSIS — G4734 Idiopathic sleep related nonobstructive alveolar hypoventilation: Secondary | ICD-10-CM

## 2013-11-22 MED ORDER — AMOXICILLIN-POT CLAVULANATE 875-125 MG PO TABS: 1.0000 | ORAL_TABLET | Freq: Two times a day (BID) | ORAL | Status: DC

## 2013-11-22 NOTE — Progress Notes (Signed)
Subjective:  Patient ID: Alex Hart, male    DOB: [birth year]   MRN: [id number]   Brief patient profile:  1 yowm smoker with doe x 2009 worse 2013 referred 10/30/2012 to pulmonary clinic by Dr Alain Marion  History of Present Illness  10/30/2012 1st pulmonary eval still smoking  With doe x pushing a mower, rushing when walking dog, otherwise has just learned to pace himself. rec Plan A = automatic = symbicort 160 Take 2 puffs first thing in am and then another 2 puffs about 12 hours later.  Only use your albuterol as a rescue medication   12/22/2012 f/u ov/[place] re GOLD III COPD/ still smoking Chief Complaint  Patient presents with  . Follow-up    Pt states breathing is unchanged since the last visit and denies any new co's.   If you stop smokiing now, your lung function should level off where it is - this is the most important aspect of your care Continue symbicort 160 Take 2 puffs first thing in am and then another 2 puffs about 12 hours later.  Only use your combivent as a rescue medication    06/27/2013 f/u ov/Kathren Scearce re: COPD GOLD III/ still smoking  Chief Complaint  Patient presents with  . Follow-up    Pt c/o SOB with any exertion, postnasal drainage, sometimes prod cough with yellow mucous X63m.  Pt states he is overusing rescue inhaler, believes it is is due to him panicking.  For months using more than twice daily combivent  Stop combivent While on prednisone double your fluid/blood pressure pill to offset fluid retention Change the protonix to 40 mg Take 30- 60 min before your first and last meals of the day until breathing/ coughing better then just one before bfast daily  Augmentin 875 mg take one pill twice daily  X 10 days - take at breakfast and supper with large glass of water.  It would help reduce the usual side effects (diarrhea and yeast infections) if you ate cultured yogurt at lunch.  Prednisone 10 mg take  4 each am x 2 days,   2 each am x 2 days,  1 each am x 2  days and stop Plan A = automatic= symbicort 160 Take 2 puffs first thing in am and then another 2 puffs about 12 hours later.  And tudorza one twice daily Plan B= Backup= proair (albuterol) ok up to 2 puffs every 4 hours but if you need it more than twice daily you need to return here  Plan C = nebulizer (hold off for now and work on maintaining off cigarettes) Plan D = Doctor, call if needed Plan E = ER, if all else fails Monitor your blood pressure carefully at home and call Dr Alain Marion if over 140/85   11/15/2013 Post ER l follow up  Was seen in ER on 4/6/ for cough , congestion and fever-102 x 5 days. In ER cxr showed 4.4 cm masslike density in left hilum. Subsequent CT showed 4x 8.7 cm left nodal conglomeration and 2.6 cm lul nodule. W/ patchy tree in bud infitrate throughout lungs. Bulky medistinal lymphadenopathy.  He was started on Levaquin  And prednisone  He is feeling some better but still weak  rec Finish Levaquin and Prednisone as directed.  Mucinex DM Twice daily  As needed  Cough/congestion  Fluids and rest    11/22/2013 post ER f/u ov/Alex Hart re: GOLD III copd/ sp ER eval for fever rx levaquin x 10 d, pred pak  Chief Complaint  Patient presents with  . Follow-up    Pt reports his breathing has improved, but not back to baseline. His cough is unchanged. No new co's today. He is using rescue inhaler at least once per day.  fever resolved, no hemoptysis / quit smoking since ER eval    No obvious day to day or daytime variabilty or assoc chronic cough or cp or chest tightness, subjective wheeze overt sinus or hb symptoms. No unusual exp hx or h/o childhood pna/ asthma or knowledge of premature birth.  Sleeping ok without nocturnal  or early am exacerbation  of respiratory  c/o's or need for noct saba. Also denies any obvious fluctuation of symptoms with weather or environmental changes or other aggravating or alleviating factors except as outlined above   Current Medications,  Allergies, Complete Past Medical History, Past Surgical History, Family History, and Social History were reviewed in [employer] record.  ROS  The following are not active complaints unless bolded sore throat, dysphagia, dental problems, itching, sneezing,  nasal congestion or excess/ purulent secretions, ear ache,   fever, chills, sweats, unintended wt loss, pleuritic or exertional cp, hemoptysis,  orthopnea pnd or leg swelling, presyncope, palpitations, heartburn, abdominal pain, anorexia, nausea, vomiting, diarrhea  or change in bowel or urinary habits, change in stools or urine, dysuria,hematuria,  rash, arthralgias, visual complaints, headache, numbness weakness or ataxia or problems with walking or coordination,  change in mood/affect or memory.                       Objective:   Physical Exam  amb wm nad  with sats improved to low 90s ra on arrival  06/27/2013     230  > 08/27/2013  216  >210 11/15/2013 > 11/22/2013  206   HEENT mild turbinate edema.  Oropharynx no thrush or excess pnd or cobblestoning.  No JVD or cervical adenopathy. Mild accessory muscle hypertrophy. Trachea midline, nl thryroid. Chest was hyperinflated by percussion with a few exp rhonchi esp LUL. Regular rate and rhythm without murmur gallop or rub or increase P2 or edema.  Abd: no hsm, nl excursion. Ext warm without cyanosis - POS mild clubbing        CXR  11/22/2013 :  Left upper lobe mass with extensive adenopathy, stable. Underlying emphysema. No consolidation. No new opacity.    Assessment & Plan:

## 2013-11-22 NOTE — Assessment & Plan Note (Signed)
-   PFT's 12/07/12 FEV1  1.12 (33%) ratio 49 and 17% improved p B2, DLCO 65 improved to 92% p correction  Finally has quit smoking now but unfortunately has progressed to severe copd > no change rx feasible

## 2013-11-22 NOTE — Patient Instructions (Addendum)
If condition worsens >  Augmentin 875 mg take one pill twice daily  X 10 days - take at breakfast and supper with large glass of water.  It would help reduce the usual side effects (diarrhea and yeast infections) if you ate cultured yogurt at lunch.   No work until seen in follow up (last worked 11/11/13)   For cough mucinex dm 1200 mg every 12 hours as needed   Please schedule a follow up office visit in 2  weeks, sooner if needed with cxr

## 2013-11-22 NOTE — Assessment & Plan Note (Signed)
-   See CT Chest 11/12/13 > treat as pneumonia with levaquin x 10 d  Almost certainly has element of post obst pneumonia, very poor candidate at this point even for FOB/ tissue dx of what would be clearly an inoperable bronchogenic ca   Discussed in detail all the  indications, usual  risks and alternatives  relative to the benefits with patient who agrees to proceed with completion of levaquin, then 10 d augmentin  , then return for regroup/ ?  bronchoscopy with biopsy in 2 weeks

## 2013-11-22 NOTE — Assessment & Plan Note (Signed)
--  ono RA 08/29/13 >  02 sat <89% x 6h 67m > rec 02 2lpm    02 sats continue to be ok at rest RA when not exacerbating

## 2013-12-07 ENCOUNTER — Ambulatory Visit (INDEPENDENT_AMBULATORY_CARE_PROVIDER_SITE_OTHER)
Admission: RE | Admit: 2013-12-07 | Discharge: 2013-12-07 | Disposition: A | Discharge status: Home / Self Care | Payer: BC Managed Care – PPO | Source: Ambulatory Visit | Attending: Internal Medicine | Admitting: Internal Medicine

## 2013-12-07 ENCOUNTER — Ambulatory Visit (INDEPENDENT_AMBULATORY_CARE_PROVIDER_SITE_OTHER): Payer: BC Managed Care – PPO | Admitting: Internal Medicine

## 2013-12-07 ENCOUNTER — Encounter: Payer: Self-pay | Admitting: Internal Medicine

## 2013-12-07 ENCOUNTER — Telehealth: Payer: Self-pay | Admitting: Internal Medicine

## 2013-12-07 VITALS — BP 124/72 | HR 98 | Temp 98.5°F | Ht 71.0 in | Wt 206.0 lb

## 2013-12-07 DIAGNOSIS — J449 Chronic obstructive pulmonary disease, unspecified: Secondary | ICD-10-CM

## 2013-12-07 DIAGNOSIS — R918 Other nonspecific abnormal finding of lung field: Secondary | ICD-10-CM

## 2013-12-07 DIAGNOSIS — J9610 Chronic respiratory failure, unspecified whether with hypoxia or hypercapnia: Secondary | ICD-10-CM

## 2013-12-07 DIAGNOSIS — R222 Localized swelling, mass and lump, trunk: Secondary | ICD-10-CM

## 2013-12-07 DIAGNOSIS — J9611 Chronic respiratory failure with hypoxia: Secondary | ICD-10-CM | POA: Insufficient documentation

## 2013-12-07 DIAGNOSIS — J4489 Other specified chronic obstructive pulmonary disease: Secondary | ICD-10-CM

## 2013-12-07 MED ORDER — ALBUTEROL SULFATE HFA 108 (90 BASE) MCG/ACT IN AERS: 2.0000 | INHALATION_SPRAY | Freq: Four times a day (QID) | RESPIRATORY_TRACT | Status: DC | PRN

## 2013-12-07 NOTE — Progress Notes (Signed)
Subjective:  Patient ID: Alex Hart, male    DOB: August 18, 1955   MRN: [id number]   Brief patient profile:  73 yowm quit smoking 11/2013 with doe x 2009 worse 2013 referred 10/30/2012 to pulmonary clinic by Dr Alain Marion with GOLD III COPD documented 12/2012   History of Present Illness  10/30/2012 1st pulmonary eval still smoking  With doe x pushing a mower, rushing when walking dog, otherwise has just learned to pace himself. rec Plan A = automatic = symbicort 160 Take 2 puffs first thing in am and then another 2 puffs about 12 hours later.  Only use your albuterol as a rescue medication   12/22/2012 f/u ov/Shreena Baines re GOLD III COPD/ still smoking Chief Complaint  Patient presents with  . Follow-up    Pt states breathing is unchanged since the last visit and denies any new co's.   If you stop smokiing now, your lung function should level off where it is - this is the most important aspect of your care Continue symbicort 160 Take 2 puffs first thing in am and then another 2 puffs about 12 hours later.  Only use your combivent as a rescue medication    06/27/2013 f/u ov/Acxel Dingee re: COPD GOLD III/ still smoking  Chief Complaint  Patient presents with  . Follow-up    Pt c/o SOB with any exertion, postnasal drainage, sometimes prod cough with yellow mucous X37m.  Pt states he is overusing rescue inhaler, believes it is is due to him panicking.  For months using more than twice daily combivent  Stop combivent While on prednisone double your fluid/blood pressure pill to offset fluid retention Change the protonix to 40 mg Take 30- 60 min before your first and last meals of the day until breathing/ coughing better then just one before bfast daily  Augmentin 875 mg take one pill twice daily  X 10 days - take at breakfast and supper with large glass of water.  It would help reduce the usual side effects (diarrhea and yeast infections) if you ate cultured yogurt at lunch.  Prednisone 10 mg take  4 each am  x 2 days,   2 each am x 2 days,  1 each am x 2 days and stop Plan A = automatic= symbicort 160 Take 2 puffs first thing in am and then another 2 puffs about 12 hours later.  And tudorza one twice daily Plan B= Backup= proair (albuterol) ok up to 2 puffs every 4 hours but if you need it more than twice daily you need to return here  Plan C = nebulizer (hold off for now and work on maintaining off cigarettes) Plan D = Doctor, call if needed Plan E = ER, if all else fails Monitor your blood pressure carefully at home and call Dr Alain Marion if over 140/85   11/15/2013 Post ER l follow up  Was seen in ER on 4/6/ for cough , congestion and fever-102 x 5 days. In ER cxr showed 4.4 cm masslike density in left hilum. Subsequent CT showed 4x 8.7 cm left nodal conglomeration and 2.6 cm lul nodule. W/ patchy tree in bud infitrate throughout lungs. Bulky medistinal lymphadenopathy.  He was started on Levaquin  And prednisone  He is feeling some better but still weak  rec Finish Levaquin and Prednisone as directed.  Mucinex DM Twice daily  As needed  Cough/congestion  Fluids and rest    11/22/2013 post ER f/u ov/Sole Lengacher re: GOLD III copd/ sp ER eval  for fever rx levaquin x 10 d, pred pak Chief Complaint  Patient presents with  . Follow-up    Pt reports his breathing has improved, but not back to baseline. His cough is unchanged. No new co's today. He is using rescue inhaler at least once per day.  fever resolved, no hemoptysis / quit smoking since ER eval  rec If condition worsens >  Augmentin 875 mg take one pill twice daily  X 10 days - take at breakfast and supper with large glass of water.  It would help reduce the usual side effects (diarrhea and yeast infections) if you ate cultured yogurt at lunch.  No work until seen in follow up (last worked 11/11/13)  For cough mucinex dm 1200 mg every 12 hours as needed  Please schedule a follow up office visit in 2  weeks, sooner if needed with cxr    12/07/2013  f/u ov/Mikel Hardgrove re: pna vs ca LUL/qualifying for 24 h 02 / no longer smoking / never took augmentin Chief Complaint  Patient presents with  . Follow-up    Pt states that his SOB is about the same. Cough is still prod, but started to produce yellow to green sputum about 1 wk ago. Using rescue inhaler approx 4 times per day.    No obvious day to day or daytime variabilty or assoc r cp or chest tightness, subjective wheeze overt sinus or hb symptoms. No unusual exp hx or h/o childhood pna/ asthma or knowledge of premature birth.  Sleeping ok without nocturnal  or early am exacerbation  of respiratory  c/o's or need for noct saba. Also denies any obvious fluctuation of symptoms with weather or environmental changes or other aggravating or alleviating factors except as outlined above   Current Medications, Allergies, Complete Past Medical History, Past Surgical History, Family History, and Social History were reviewed in [employer] record.  ROS  The following are not active complaints unless bolded sore throat, dysphagia, dental problems, itching, sneezing,  nasal congestion or excess/ purulent secretions, ear ache,   fever, chills, sweats, unintended wt loss, pleuritic or exertional cp, hemoptysis,  orthopnea pnd or leg swelling, presyncope, palpitations, heartburn, abdominal pain, anorexia, nausea, vomiting, diarrhea  or change in bowel or urinary habits, change in stools or urine, dysuria,hematuria,  rash, arthralgias, visual complaints, headache, numbness weakness or ataxia or problems with walking or coordination,  change in mood/affect or memory.                       Objective:   Physical Exam  amb wm nad     06/27/2013     230  > 08/27/2013  216  >210 11/15/2013 > 11/22/2013  206 > 12/08/2013 206   HEENT mild turbinate edema.  Oropharynx no thrush or excess pnd or cobblestoning.  No JVD or cervical adenopathy. Mild accessory muscle hypertrophy. Trachea midline, nl  thryroid. Chest was hyperinflated by percussion with a few exp rhonchi esp LUL. Regular rate and rhythm without murmur gallop or rub or increase P2 or edema.  Abd: no hsm, nl excursion. Ext warm without cyanosis - POS mild clubbing        CXR  12/07/13 Stable left upper lobe mass as well as mediastinal adenopathy  compared to prior exam. These findings are concerning for  malignancy.     Assessment & Plan:

## 2013-12-07 NOTE — Patient Instructions (Addendum)
Go ahead and take the augmentin   Call me in one week to discuss possibility of bronchoscopy  Please see patient coordinator before you leave today  to schedule 24 h 02   Please schedule a follow up office visit in 4 weeks, sooner if needed

## 2013-12-07 NOTE — Telephone Encounter (Signed)
Spoke with pt. Aware rescue inhaler has been sent to [facility] in [place]. Nothing further needed

## 2013-12-08 NOTE — Assessment & Plan Note (Signed)
-   See CT Chest 11/12/13 > treat as pneumonia with levaquin x 10 d - 12/07/2013 repeat abx x 10 d augmentin then FOB next option  Discussed in detail all the  indications, usual  risks and alternatives  relative to the benefits with patient who agrees to proceed with bronchoscopy with biopsy if not better

## 2013-12-08 NOTE — Assessment & Plan Note (Signed)
s/p smoking cessation 11/2013  - HFA  90% 11/22/2013  - PFT's 12/07/12 FEV1  1.12 (33%) ratio 49 and 17% improved p B2, DLCO 65 improved to 92% p correction  Reinforced smoking cessation but at this point even if proved to have a resectable tumor he would not be operable    Each maintenance medication was reviewed in detail including most importantly the difference between maintenance and as needed and under what circumstances the prns are to be used.  Please see instructions for details which were reviewed in writing and the patient given a copy.

## 2013-12-08 NOTE — Assessment & Plan Note (Signed)
02 % 86% RA 12/07/2013 > 24 h 02 2lpm  He should go ahead at this point and seek full disability

## 2013-12-10 ENCOUNTER — Telehealth: Payer: Self-pay | Admitting: Internal Medicine

## 2013-12-10 NOTE — Telephone Encounter (Signed)
Spoke with [place]. Forms were dropped for disability. She has sent these down to [employer].  I called pt to make him aware of the process for disability paperwork and where this was sent. He voiced his understanding and needed nothing further

## 2013-12-13 ENCOUNTER — Telehealth: Payer: Self-pay | Admitting: Internal Medicine

## 2013-12-13 MED ORDER — ACLIDINIUM BROMIDE 400 MCG/ACT IN AEPB: 1.0000 | INHALATION_SPRAY | Freq: Two times a day (BID) | RESPIRATORY_TRACT | Status: DC

## 2013-12-13 NOTE — Telephone Encounter (Signed)
Pt needed #90 called in for tudorza. I have done so. Nothing futher needed

## 2013-12-14 ENCOUNTER — Telehealth: Payer: Self-pay | Admitting: Internal Medicine

## 2013-12-14 DIAGNOSIS — R918 Other nonspecific abnormal finding of lung field: Secondary | ICD-10-CM

## 2013-12-14 NOTE — Telephone Encounter (Signed)
Discussed in detail all the  indications, usual  risks and alternatives  relative to the benefits with patient who agrees to proceed with bronchoscopy with biopsy> tentively scheduled for 12/19/13

## 2013-12-14 NOTE — Telephone Encounter (Signed)
Pt saw MW 12/07/13: Go ahead and take the augmentin  Call me in one week to discuss possibility of bronchoscopy Please see patient coordinator before you leave today  to schedule 24 h 02   Please schedule a follow up office visit in 4 weeks, sooner if needed   ---  Called spoke with pt. He reports the cough is subsiding but not gone. The phlem is thick but clear now. Breathing is still little short but better. No wheezing, no chest tx. He was calling to let Dr. Melvyn Novas know how he is doing. Pt has pending appt 01/07/14. Please advise MW thanks

## 2013-12-17 ENCOUNTER — Telehealth: Payer: Self-pay | Admitting: Internal Medicine

## 2013-12-17 NOTE — Telephone Encounter (Signed)
Per chart forms were sent down to [employer] on 12-10-13 to be processed and sent up to MW to sign. I spoke with the pt and made him aware of the process and that I will check with MW to see if he has completed these forms yet? If they have not been sent up yet will need to contact [employer]. Please advise. Argyle Bing, CMA

## 2013-12-17 NOTE — Telephone Encounter (Signed)
We did our part on 12/17/13 and sent back down to [facility]!! I called Hoyle Sauer and instead spoke with Sharyn Lull in [place] if off until tomorrow and apparently is the only person who can help with this  [facility] for the pt

## 2013-12-17 NOTE — Telephone Encounter (Signed)
I spoke with the pt and let him know that MW completed his part today and this was sent back to [employer]. Pt is aware he should hear from [employer]. If anything further needed then to call us back. [place] Bing, CMA

## 2013-12-17 NOTE — Telephone Encounter (Signed)
Didn't we send these back?

## 2013-12-17 NOTE — Telephone Encounter (Signed)
Pt returned call

## 2013-12-18 ENCOUNTER — Telehealth: Payer: Self-pay | Admitting: Internal Medicine

## 2013-12-18 NOTE — Telephone Encounter (Signed)
[id number] to advise they call the day before the procedure. Doland Bing, CMA

## 2013-12-19 ENCOUNTER — Ambulatory Visit (HOSPITAL_COMMUNITY)
Admission: RE | Admit: 2013-12-19 | Discharge: 2013-12-19 | Disposition: A | Discharge status: Home / Self Care | Payer: BC Managed Care – PPO | Source: Ambulatory Visit | Attending: Internal Medicine | Admitting: Internal Medicine

## 2013-12-19 ENCOUNTER — Encounter (HOSPITAL_COMMUNITY)
Admission: RE | Disposition: A | Discharge status: Home / Self Care | Payer: BC Managed Care – PPO | Source: Ambulatory Visit | Attending: Internal Medicine

## 2013-12-19 ENCOUNTER — Encounter (HOSPITAL_COMMUNITY): Payer: Self-pay | Admitting: Respiratory Therapy

## 2013-12-19 DIAGNOSIS — J9610 Chronic respiratory failure, unspecified whether with hypoxia or hypercapnia: Secondary | ICD-10-CM | POA: Insufficient documentation

## 2013-12-19 DIAGNOSIS — J449 Chronic obstructive pulmonary disease, unspecified: Secondary | ICD-10-CM | POA: Insufficient documentation

## 2013-12-19 DIAGNOSIS — Z87891 Personal history of nicotine dependence: Secondary | ICD-10-CM | POA: Insufficient documentation

## 2013-12-19 DIAGNOSIS — R222 Localized swelling, mass and lump, trunk: Secondary | ICD-10-CM | POA: Insufficient documentation

## 2013-12-19 DIAGNOSIS — J3801 Paralysis of vocal cords and larynx, unilateral: Secondary | ICD-10-CM | POA: Insufficient documentation

## 2013-12-19 DIAGNOSIS — R599 Enlarged lymph nodes, unspecified: Secondary | ICD-10-CM | POA: Insufficient documentation

## 2013-12-19 DIAGNOSIS — J4489 Other specified chronic obstructive pulmonary disease: Secondary | ICD-10-CM | POA: Insufficient documentation

## 2013-12-19 DIAGNOSIS — R918 Other nonspecific abnormal finding of lung field: Secondary | ICD-10-CM

## 2013-12-19 HISTORY — PX: VIDEO BRONCHOSCOPY: SHX5072

## 2013-12-19 SURGERY — VIDEO BRONCHOSCOPY WITHOUT FLUORO
Anesthesia: Moderate Sedation | Laterality: Bilateral

## 2013-12-19 MED ORDER — LIDOCAINE HCL 2 % EX GEL
CUTANEOUS | Status: DC | PRN
  Administered 2013-12-19: 2

## 2013-12-19 MED ORDER — PHENYLEPHRINE HCL 0.25 % NA SOLN
NASAL | Status: DC | PRN
  Administered 2013-12-19: 2 via NASAL

## 2013-12-19 MED ORDER — SODIUM CHLORIDE 0.9 % IV SOLN
INTRAVENOUS | Status: DC
  Administered 2013-12-19: 08:00:00 via INTRAVENOUS

## 2013-12-19 MED ORDER — LIDOCAINE HCL 1 % IJ SOLN
INTRAMUSCULAR | Status: DC | PRN
  Administered 2013-12-19: 2 mL via RESPIRATORY_TRACT

## 2013-12-19 MED ORDER — EPINEPHRINE HCL 0.1 MG/ML IJ SOSY
PREFILLED_SYRINGE | INTRAMUSCULAR | Status: DC | PRN
  Administered 2013-12-19: 0.1 mg via INTRAVENOUS

## 2013-12-19 MED ORDER — ALBUTEROL SULFATE (2.5 MG/3ML) 0.083% IN NEBU
2.5000 mg | INHALATION_SOLUTION | Freq: Once | RESPIRATORY_TRACT | Status: AC
  Administered 2013-12-19: 2.5 mg via RESPIRATORY_TRACT

## 2013-12-19 MED ORDER — MIDAZOLAM HCL 10 MG/2ML IJ SOLN
INTRAMUSCULAR | Status: AC
  Filled 2013-12-19: qty 4

## 2013-12-19 MED ORDER — MIDAZOLAM HCL 10 MG/2ML IJ SOLN
INTRAMUSCULAR | Status: DC | PRN
  Administered 2013-12-19 (×2): 2.5 mg via INTRAVENOUS

## 2013-12-19 MED ORDER — MIDAZOLAM HCL 10 MG/2ML IJ SOLN
1.0000 mg | Freq: Once | INTRAMUSCULAR | Status: DC
Start: 2013-12-19 — End: 2013-12-19

## 2013-12-19 MED ORDER — METHYLPREDNISOLONE SODIUM SUCC 125 MG IJ SOLR
125.0000 mg | Freq: Once | INTRAMUSCULAR | Status: AC
  Administered 2013-12-19: 125 mg via INTRAVENOUS
  Filled 2013-12-19 (×2): qty 2

## 2013-12-19 MED ORDER — MEPERIDINE HCL 25 MG/ML IJ SOLN
INTRAMUSCULAR | Status: DC | PRN
  Administered 2013-12-19 (×2): 25 mg via INTRAVENOUS

## 2013-12-19 MED ORDER — MEPERIDINE HCL 100 MG/ML IJ SOLN
INTRAMUSCULAR | Status: AC
  Filled 2013-12-19: qty 2

## 2013-12-19 MED ORDER — MEPERIDINE HCL 100 MG/ML IJ SOLN
100.0000 mg | Freq: Once | INTRAMUSCULAR | Status: DC
Start: 2013-12-19 — End: 2013-12-19

## 2013-12-19 NOTE — H&P (Signed)
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 rec Plan A = automatic = symbicort 160 Take 2 puffs first thing in am and then another 2 puffs about 12 hours later.   Only use your albuterol as a rescue medication     12/22/2012 f/u ov/Alex Hart re GOLD III COPD/ still smoking Chief Complaint   Patient presents with   .  Follow-up       Pt states breathing is unchanged since the last visit and denies any new co's.     If you stop smokiing now, your lung function should level off where it is - this is the most important aspect of your care Continue symbicort 160 Take 2 puffs first thing in am and then another 2 puffs about 12 hours later.  Only use your combivent as a rescue medication      06/27/2013 f/u ov/Alex Hart re: COPD GOLD III/ still smoking   Chief Complaint   Patient presents with   .  Follow-up       Pt c/o SOB with any exertion, postnasal drainage, sometimes prod cough with yellow mucous X14m.  Pt states he is overusing rescue inhaler, believes it is is due to him panicking.    For months using more than twice daily combivent   Stop combivent While on prednisone double your fluid/blood pressure pill to offset fluid retention Change the protonix to 40 mg Take 30- 60 min before your first and last meals of the day until breathing/ coughing better then just one before bfast daily   Augmentin 875 mg take one pill twice daily  X 10 days - take at breakfast and supper with large glass of water.  It would help reduce the usual side effects (diarrhea and yeast infections) if you ate cultured yogurt at lunch.   Prednisone 10 mg take  4 each am x 2  days,   2 each am x 2 days,  1 each am x 2 days and stop Plan A = automatic= symbicort 160 Take 2 puffs first thing in am and then another 2 puffs about 12 hours later.   And tudorza one twice daily Plan B= Backup= proair (albuterol) ok up to 2 puffs every 4 hours but if you need it more than twice daily you need to return here   Plan C = nebulizer (hold off for now and work on maintaining off cigarettes) Plan D = Doctor, call if needed Plan E = ER, if all else fails Monitor your blood pressure carefully at home and call Dr Alain Marion if over 140/85     11/15/2013 Post ER l follow up   Was seen in ER on 4/6/ for cough , congestion and fever-102 x 5 days. In ER cxr showed 4.4 cm masslike density in left hilum. Subsequent CT showed 4x 8.7 cm left nodal conglomeration and 2.6 cm lul nodule. W/ patchy tree in bud infitrate throughout lungs. Bulky medistinal lymphadenopathy.   He was started on Levaquin  And prednisone   He is feeling some better but still weak  rec Finish Levaquin and Prednisone as directed.   Mucinex DM Twice  daily  As needed  Cough/congestion   Fluids and rest     11/22/2013 post ER f/u ov/Alex Hart re: GOLD III copd/ sp ER eval for fever rx levaquin x 10 d, pred pak Chief Complaint   Patient presents with   .  Follow-up       Pt reports his breathing has improved, but not back to baseline. His cough is unchanged. No new co's today. He is using rescue inhaler at least once per day.    fever resolved, no hemoptysis / quit smoking since ER eval   rec If condition worsens >  Augmentin 875 mg take one pill twice daily  X 10 days - take at breakfast and supper with large glass of water.  It would help reduce the usual side effects (diarrhea and yeast infections) if you ate cultured yogurt at lunch.   No work until seen in follow up (last worked 11/11/13)   For cough mucinex dm 1200 mg every 12 hours as needed   Please schedule a follow up office visit in 2  weeks, sooner if needed with  cxr      12/07/2013 f/u ov/Alex Hart re: pna vs ca LUL/qualifying for 24 h 02 / no longer smoking / never took augmentin Chief Complaint   Patient presents with   .  Follow-up       Pt states that his SOB is about the same. Cough is still prod, but started to produce yellow to green sputum about 1 wk ago. Using rescue inhaler approx 4 times per day.       No obvious day to day or daytime variabilty or assoc r cp or chest tightness, subjective wheeze overt sinus or hb symptoms. No unusual exp hx or h/o childhood pna/ asthma or knowledge of premature birth.   Sleeping ok without nocturnal  or early am exacerbation  of respiratory  c/o's or need for noct saba. Also denies any obvious fluctuation of symptoms with weather or environmental changes or other aggravating or alleviating factors except as outlined above    Current Medications, Allergies, Complete Past Medical History, Past Surgical History, Family History, and Social History were reviewed in [employer] record.   ROS  The following are not active complaints unless bolded sore throat, dysphagia, dental problems, itching, sneezing,  nasal congestion or excess/ purulent secretions, ear ache,   fever, chills, sweats, unintended wt loss, pleuritic or exertional cp, hemoptysis,  orthopnea pnd or leg swelling, presyncope, palpitations, heartburn, abdominal pain, anorexia, nausea, vomiting, diarrhea  or change in bowel or urinary habits, change in stools or urine, dysuria,hematuria,  rash, arthralgias, visual complaints, headache, numbness weakness or ataxia or problems with walking or coordination,  change in mood/affect or memory.                              Objective:     Physical Exam   amb wm nad      06/27/2013     230  > 08/27/2013  216  >210 11/15/2013 > 11/22/2013  206 > 12/08/2013 206    HEENT mild turbinate edema.  Oropharynx no thrush or excess pnd or cobblestoning.  No JVD or cervical  adenopathy. Mild accessory muscle hypertrophy. Trachea midline, nl thryroid. Chest was hyperinflated by percussion with a few exp rhonchi esp LUL. Regular rate and rhythm without murmur gallop or rub or increase P2 or edema.  Abd: no hsm, nl excursion.  Ext warm without cyanosis - POS mild clubbing          CXR  12/07/13 Stable left upper lobe mass as well as mediastinal adenopathy   compared to prior exam. These findings are concerning for   malignancy.          Assessment & Plan:    COPD GOLD III -   s/p smoking cessation 11/2013  - HFA  90% 11/22/2013   - PFT's 12/07/12 FEV1  1.12 (33%) ratio 49 and 17% improved p B2, DLCO 65 improved to 92% p correction   Reinforced smoking cessation but at this point even if proved to have a resectable tumor he would not be operable  Each maintenance medication was reviewed in detail including most importantly the difference between maintenance and as needed and under what circumstances the prns are to be used.  Please see instructions for details which were reviewed in writing and the patient given a copy.            Lung mass -    - See CT Chest 11/12/13 > treat as pneumonia with levaquin x 10 d - 12/07/2013 repeat abx x 10 d augmentin then FOB next option   Discussed in detail all the  indications, usual  risks and alternatives  relative to the benefits with patient who agrees to proceed with bronchoscopy with biopsy if not better        Chronic respiratory failure -    02 % 86% RA 12/07/2013 > 24 h 02 2lpm   He should go ahead at this point and seek full disability     Patient Instructions      Go ahead and take the augmentin    Call me in one week to discuss possibility of bronchoscopy   Please see patient coordinator before you leave today  to schedule 24 h 02       12/19/2013 Day of FOB no change hx or exam  Discussed in detail all the  indications, usual  risks and alternatives  relative to the benefits with patient who agrees  to proceed with bronchoscopy with biopsy.   Christinia Gully, MD Pulmonary and [place] [phone] After 5:30 PM or weekends, call [phone]

## 2013-12-19 NOTE — Progress Notes (Signed)
Video bronchoscopy  Intervention bronchial washing  Intervention bronchial biopsy  Good patient tolerance.

## 2013-12-19 NOTE — Telephone Encounter (Signed)
ATC PT a women answered but line was breaking up. Could not hear anything. Retried again and same thing happened. WCB

## 2013-12-19 NOTE — Discharge Instructions (Signed)
Flexible Bronchoscopy, Care After These instructions give you information on caring for yourself after your procedure. Your doctor may also give you more specific instructions. Call your doctor if you have any problems or questions after your procedure. HOME CARE  Do not eat or drink anything for 2 hours after your procedure. If you try to eat or drink before the medicine wears off, food or drink could go into your lungs. You could also burn yourself.  After 2 hours have passed and when you can cough and gag normally, you may eat soft food and drink liquids slowly.  The day after the test, you may eat your normal diet.  You may do your normal activities.  Keep all doctor visits. GET HELP RIGHT AWAY IF:  You get more and more short of breath.  You get lightheaded.  You feel like you are going to pass out (faint).  You have chest pain.  You have new problems that worry you.  You cough up more than a little blood.  You cough up more blood than before. MAKE SURE YOU:  Understand these instructions.  Will watch your condition.  Will get help right away if you are not doing well or get worse. Document Released: 05/23/2009 Document Revised: 05/16/2013 Document Reviewed: 03/30/2013 [facility] Patient Information 2014 [place].   Do not eat or drink anything today until 1030am May 13,2015.

## 2013-12-19 NOTE — Op Note (Signed)
Bronchoscopy Procedure Note  Date of Operation: 12/19/2013   Pre-op Diagnosis: lung mass  Post-op Diagnosis: same  Surgeon: Christinia Gully  Anesthesia: Monitored Local Anesthesia with Sedation  Operation: Video Flexible fiberoptic bronchoscopy, diagnostic   Findings:1) Paralyzed L true VC 2)  friable narrowed LUL post segmental orifice  Specimen: BAL LUL/ endobronchial bx LUL orifice x   Estimated Blood Loss: 25 cc   Complications: bleeding before and after bx controlled with one amp Epi topically   Indications and History: See updated H and P same date. The risks, benefits, complications, treatment options and expected outcomes were discussed with the patient.  The possibilities of reaction to medication, pulmonary aspiration, perforation of a viscus, bleeding, failure to diagnose a condition and creating a complication requiring transfusion or operation were discussed with the patient who freely signed the consent.    Description of Procedure: The patient was re-examined in the bronchoscopy suite and the site of surgery properly noted/marked.  The patient was identified  and the procedure verified as Flexible Fiberoptic Bronchoscopy.  A Time Out was held and the above information confirmed.   After the induction of topical nasopharyngeal anesthesia, the patient was positioned  and the bronchoscope was passed through the L naris. The vocal cords were visualized and  1% buffered lidocaine 5 ml was topically placed onto the cords. The cords were Paralyzed on L. The scope was then passed into the trachea.  1% buffered lidocaine given topically. Airways inspected bilaterally to the subsegmental level with the following findings:  Copious mucoid secretions but mucosa ok to the subsegmental level except for  1) Orifice of LUL post segment swollen, very friable  Interventions LUL BAL LUL orifice bx x 2      The Patient was taken to the Endoscopy Recovery area in satisfactory  condition.    Attestation: I performed the procedure.  Christinia Gully, MD Pulmonary and [place] [phone] After 5:30 PM or weekends, call [phone]

## 2013-12-20 ENCOUNTER — Encounter (HOSPITAL_COMMUNITY): Payer: Self-pay | Admitting: Internal Medicine

## 2013-12-20 NOTE — Telephone Encounter (Signed)
Pt has already had BX done. Will sign off

## 2013-12-21 ENCOUNTER — Other Ambulatory Visit: Payer: Self-pay | Admitting: Internal Medicine

## 2013-12-21 DIAGNOSIS — R918 Other nonspecific abnormal finding of lung field: Secondary | ICD-10-CM

## 2013-12-24 ENCOUNTER — Telehealth: Payer: Self-pay | Admitting: Internal Medicine

## 2013-12-24 NOTE — Telephone Encounter (Signed)
Pt states that he called his pharmacy to get a refill of his Ruthe Mannan and they denied it. Pt not fully understanding why this medication was denied-never had this trouble in the past.   Spoke with [facility] Pharmacist states that pt insurance has a qty limitation on his medications. His insurance will only allow a certain qty to be dispensed in a 30-day period that will be covered.  Per pharmacist, because a 30-day supply was filled last month, they are not allowing a refill this month of a 30 day supply or 90-day supply without a PA being done. I have been advised by pt pharmacy to contact [facility] to speak with them about this and do PA over the phone for Qty Limitation override.   I have called [facility] [phone] to speak with them about pt medication and automated service states that all reps are on the phone and to try call again.  I have attempted to call x 5 with no success--each time being d/c d/t no reps being available.   Dr Melvyn Novas, please advise if in the meantime of [place] trying to contact [facility] if patient can take Symbicort--pt states that he has Symbicort at home.  Pt states that he is completely out of Dulera. Currently using Tudorza 1 puff BID also. Symbicort was switched to [facility] 09/18/13 d/t no insurance cov'g.

## 2013-12-24 NOTE — Telephone Encounter (Signed)
lmomtcb x1 for pt 

## 2013-12-24 NOTE — Telephone Encounter (Signed)
Ok to change to symbicort 2bid - also would ask what formulary alternatives there are for copd that don't require PA

## 2013-12-24 NOTE — Telephone Encounter (Signed)
Spoke with the pt and notified of recs per MW Pt verbalized understanding  I left him sample up front of symbicort  He will bring formulary to next ov 01/07/14 Nothing further needed per pt

## 2013-12-25 ENCOUNTER — Telehealth: Payer: Self-pay | Admitting: Internal Medicine

## 2013-12-25 MED ORDER — MOMETASONE FURO-FORMOTEROL FUM 200-5 MCG/ACT IN AERO: 2.0000 | INHALATION_SPRAY | Freq: Two times a day (BID) | RESPIRATORY_TRACT | Status: DC

## 2013-12-25 NOTE — Telephone Encounter (Signed)
dulera is on the formulary list so not clear why they denied it - just give him two samples of the 200 and we'll regroup later

## 2013-12-25 NOTE — Telephone Encounter (Signed)
Formulary in [facility] lookat

## 2013-12-25 NOTE — Telephone Encounter (Signed)
I received request from pt's pharmacy today requesting 90 days supply dulera, so they will cover this it just needs to be for 90 days I spoke with wife and notified her of this and she verbalized understanding [facility] [place] [facility] her request

## 2014-01-03 ENCOUNTER — Ambulatory Visit (HOSPITAL_COMMUNITY)
Admission: RE | Admit: 2014-01-03 | Discharge: 2014-01-03 | Disposition: A | Discharge status: Home / Self Care | Payer: BC Managed Care – PPO | Source: Ambulatory Visit | Attending: Internal Medicine | Admitting: Internal Medicine

## 2014-01-03 ENCOUNTER — Encounter (HOSPITAL_COMMUNITY): Payer: Self-pay

## 2014-01-03 DIAGNOSIS — R222 Localized swelling, mass and lump, trunk: Secondary | ICD-10-CM | POA: Insufficient documentation

## 2014-01-03 DIAGNOSIS — R599 Enlarged lymph nodes, unspecified: Secondary | ICD-10-CM | POA: Insufficient documentation

## 2014-01-03 DIAGNOSIS — R918 Other nonspecific abnormal finding of lung field: Secondary | ICD-10-CM

## 2014-01-03 DIAGNOSIS — C771 Secondary and unspecified malignant neoplasm of intrathoracic lymph nodes: Secondary | ICD-10-CM | POA: Insufficient documentation

## 2014-01-03 LAB — GLUCOSE, CAPILLARY: Glucose-Capillary: 154 mg/dL — ABNORMAL HIGH (ref 70–99)

## 2014-01-03 MED ORDER — FLUDEOXYGLUCOSE F - 18 (FDG) INJECTION
10.6000 | Freq: Once | INTRAVENOUS | Status: AC | PRN
  Administered 2014-01-03: 10.6 via INTRAVENOUS

## 2014-01-04 ENCOUNTER — Encounter: Payer: Self-pay | Admitting: Internal Medicine

## 2014-01-04 ENCOUNTER — Other Ambulatory Visit: Payer: Self-pay | Admitting: Internal Medicine

## 2014-01-04 ENCOUNTER — Telehealth: Payer: Self-pay | Admitting: Internal Medicine

## 2014-01-04 DIAGNOSIS — R918 Other nonspecific abnormal finding of lung field: Secondary | ICD-10-CM

## 2014-01-04 NOTE — Telephone Encounter (Signed)
MW  Did you call this pt?   Is there something that i can let him know?

## 2014-01-04 NOTE — Telephone Encounter (Signed)
Left message with pt and talked to wife re CT bx

## 2014-01-07 ENCOUNTER — Other Ambulatory Visit: Payer: Self-pay | Admitting: Radiology

## 2014-01-07 ENCOUNTER — Encounter: Payer: Self-pay | Admitting: Internal Medicine

## 2014-01-07 ENCOUNTER — Ambulatory Visit (INDEPENDENT_AMBULATORY_CARE_PROVIDER_SITE_OTHER): Payer: BC Managed Care – PPO | Admitting: Internal Medicine

## 2014-01-07 VITALS — BP 118/60 | HR 104 | Temp 98.7°F | Ht 71.0 in | Wt 205.0 lb

## 2014-01-07 DIAGNOSIS — J449 Chronic obstructive pulmonary disease, unspecified: Secondary | ICD-10-CM

## 2014-01-07 DIAGNOSIS — J9610 Chronic respiratory failure, unspecified whether with hypoxia or hypercapnia: Secondary | ICD-10-CM

## 2014-01-07 DIAGNOSIS — R918 Other nonspecific abnormal finding of lung field: Secondary | ICD-10-CM

## 2014-01-07 DIAGNOSIS — R222 Localized swelling, mass and lump, trunk: Secondary | ICD-10-CM

## 2014-01-07 DIAGNOSIS — J4489 Other specified chronic obstructive pulmonary disease: Secondary | ICD-10-CM

## 2014-01-07 MED ORDER — TIOTROPIUM BROMIDE MONOHYDRATE 2.5 MCG/ACT IN AERS: INHALATION_SPRAY | RESPIRATORY_TRACT | Status: DC

## 2014-01-07 NOTE — Progress Notes (Signed)
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 rec Plan A = automatic = symbicort 160 Take 2 puffs first thing in am and then another 2 puffs about 12 hours later.  Only use your albuterol as a rescue medication   12/22/2012 f/u ov/Alex Hart re GOLD III COPD/ still smoking Chief Complaint  Patient presents with  . Follow-up    Pt states breathing is unchanged since the last visit and denies any new co's.   If you stop smokiing now, your lung function should level off where it is - this is the most important aspect of your care Continue symbicort 160 Take 2 puffs first thing in am and then another 2 puffs about 12 hours later.  Only use your combivent as a rescue medication    06/27/2013 f/u ov/Alex Hart re: COPD GOLD III/ still smoking  Chief Complaint  Patient presents with  . Follow-up    Pt c/o SOB with any exertion, postnasal drainage, sometimes prod cough with yellow mucous X56m.  Pt states he is overusing rescue inhaler, believes it is is due to him panicking.  For months using more than twice daily combivent  Stop combivent While on prednisone double your fluid/blood pressure pill to offset fluid retention Change the protonix to 40 mg Take 30- 60 min before your first and last meals of the day until breathing/ coughing better then just one before bfast daily  Augmentin 875 mg take one pill twice daily  X 10 days - take at breakfast and supper with large glass of water.  It would help reduce the usual side effects (diarrhea and yeast infections) if you ate cultured yogurt at lunch.  Prednisone 10 mg take  4 each am  x 2 days,   2 each am x 2 days,  1 each am x 2 days and stop Plan A = automatic= symbicort 160 Take 2 puffs first thing in am and then another 2 puffs about 12 hours later.  And tudorza one twice daily Plan B= Backup= proair (albuterol) ok up to 2 puffs every 4 hours but if you need it more than twice daily you need to return here  Plan C = nebulizer (hold off for now and work on maintaining off cigarettes) Plan D = Doctor, call if needed Plan E = ER, if all else fails Monitor your blood pressure carefully at home and call Dr Alex Hart if over 140/85   11/15/2013 Post ER l follow up  Was seen in ER on 4/6/ for cough , congestion and fever-102 x 5 days. In ER cxr showed 4.4 cm masslike density in left hilum. Subsequent CT showed 4x 8.7 cm left nodal conglomeration and 2.6 cm lul nodule. W/ patchy tree in bud infitrate throughout lungs. Bulky medistinal lymphadenopathy.  He was started on Levaquin  And prednisone  He is feeling some better but still weak  rec Finish Levaquin and Prednisone as directed.  Mucinex DM Twice daily  As needed  Cough/congestion  Fluids and rest    11/22/2013 post ER f/u ov/Alex Hart re: GOLD III copd/ sp ER eval  for fever rx levaquin x 10 d, pred pak Chief Complaint  Patient presents with  . Follow-up    Pt reports his breathing has improved, but not back to baseline. His cough is unchanged. No new co's today. He is using rescue inhaler at least once per day.  fever resolved, no hemoptysis / quit smoking since ER eval  rec If condition worsens >  Augmentin 875 mg take one pill twice daily  X 10 days - take at breakfast and supper with large glass of water.  It would help reduce the usual side effects (diarrhea and yeast infections) if you ate cultured yogurt at lunch.  No work until seen in follow up (last worked 11/11/13)  For cough mucinex dm 1200 mg every 12 hours as needed  Please schedule a follow up office visit in 2  weeks, sooner if needed with cxr    12/07/2013  f/u ov/Alex Hart re: pna vs ca LUL/qualifying for 24 h 02 / no longer smoking / never took augmentin Chief Complaint  Patient presents with  . Follow-up    Pt states that his SOB is about the same. Cough is still prod, but started to produce yellow to green sputum about 1 wk ago. Using rescue inhaler approx 4 times per day.   rec Go ahead and take the augmentin  Call me in one week to discuss possibility of bronchoscopy Please see patient coordinator before you leave today  to schedule 24 h 02    FOB 12/19/13 >  L true vocal cord paralysis, endobronchial dz LUL > crush aritifact suggestive/ not dx of small cell - PET 11/07/00 > Hypermetabolic masses in the central left upper lobe and medial left lung apex, suspicious for synchronous primary bronchogenic Carcinomas > CT guided bx rec   01/07/2014 f/u ov/Alex Hart re: copd/ lung mass  Chief Complaint  Patient presents with  . Follow-up    Pt states his breathing is unchanged since last visit. Voice is becoming more hoarse and he is having some difficulty with swallowing.      No hemoptysis/ no purulent sputum.  Very sedentary but no sob on 02     No obvious day to day or daytime variabilty or assoc r cp or chest tightness, subjective wheeze overt sinus or hb symptoms. No unusual exp hx or h/o childhood pna/ asthma or knowledge of premature birth.  Sleeping ok without nocturnal  or early am exacerbation  of respiratory  c/o's or need for noct saba. Also denies any obvious fluctuation of symptoms with weather or environmental changes or other aggravating or alleviating factors except as outlined above   Current Medications, Allergies, Complete Past Medical History, Past Surgical History, Family History, and Social History were reviewed in [employer] record.  ROS  The following are not active complaints unless bolded sore throat, dysphagia, dental problems, itching, sneezing,  nasal congestion or excess/ purulent secretions, ear  ache,   fever, chills, sweats, unintended wt loss, pleuritic or exertional cp, hemoptysis,  orthopnea pnd or leg swelling, presyncope, palpitations, heartburn, abdominal pain, anorexia, nausea, vomiting, diarrhea  or change in bowel or urinary habits, change in stools or urine, dysuria,hematuria,  rash, arthralgias, visual complaints, headache, numbness weakness or ataxia or problems with walking or coordination,  change in mood/affect or memory.                 Objective:   Physical Exam  amb wm nad     06/27/2013  230  > 08/27/2013  216  >210 11/15/2013 > 11/22/2013  206 > 12/08/2013 206 > 01/09/2014  205   HEENT mild turbinate edema.  Oropharynx no thrush or excess pnd or cobblestoning.  No JVD or cervical adenopathy. Mild accessory muscle hypertrophy. Trachea midline, nl thryroid. Chest was hyperinflated by percussion with a few exp rhonchi esp LUL. Regular rate and rhythm without murmur gallop or rub or increase P2 or edema.  Abd: no hsm, nl excursion. Ext warm without cyanosis - POS mild clubbing        CXR  12/07/13 Stable left upper lobe mass as well as mediastinal adenopathy  compared to prior exam. These findings are concerning for  malignancy.     Assessment & Plan:

## 2014-01-07 NOTE — Patient Instructions (Signed)
Spiriva 2 puffs each am   Please see patient coordinator before you leave today  to schedule skinny needle biopsy and we will call you with results and your 02 needs = POC  Please schedule a follow up office visit in 6 weeks, call sooner if needed

## 2014-01-09 ENCOUNTER — Other Ambulatory Visit: Payer: Self-pay | Admitting: Radiology

## 2014-01-09 NOTE — Assessment & Plan Note (Signed)
-   See CT Chest 11/12/13 > treat as LUL post segment  pneumonia with levaquin x 10 d - 12/07/2013 repeat abx x 10 d augmentin then FOB 12/19/13 > paralyzed L True VC,  endobronchial dz LUL not obst > crush aritifact suggestive/ not dx of small cell - PET 7/61/47 > Hypermetabolic masses in the central left upper lobe and medial left lung apex, suspicious for synchronous primary bronchogenic carcinomas. Bulky left hilar and mediastinal lymph node metastases. Mild metastatic abdominal lymphadenopathy in the central small bowel Mesentery.   I had an extended discussion with the patient today lasting 15 to 20 minutes of a 25 minute visit on the following issues:  Need tissue dx asap for what is clearly unresectable lung ca and may actually be small cell or two different primaries > will start with CT bx asap and see if tissue is similar and if not consider repeat fob.   Discussed in detail all the  indications, usual  risks and alternatives  relative to the benefits with patient who agrees to proceed with CT bx

## 2014-01-09 NOTE — Assessment & Plan Note (Signed)
02 % 86% RA 12/07/2013 > 24 h 02 2lpm  Adequate control on present rx, reviewed > no change in rx needed

## 2014-01-09 NOTE — Assessment & Plan Note (Addendum)
-   HFA  90% 11/22/2013  - PFT's 12/07/12 FEV1  1.12 (33%) ratio 49 and 17% improved p B2, DLCO 65 improved to 92% p correction  Adequate control on present rx, reviewed > no change in rx needed  But his insurance won't pay for tudorza  Try spiriva respimat  The proper method of use, as well as anticipated side effects, of  This new type metered-dose inhaler are discussed and demonstrated to the patient. Improved effectiveness after extensive coaching during this visit to a level of approximately  90%

## 2014-01-10 ENCOUNTER — Ambulatory Visit (HOSPITAL_COMMUNITY)
Admission: RE | Admit: 2014-01-10 | Discharge: 2014-01-10 | Disposition: A | Discharge status: Home / Self Care | Payer: BC Managed Care – PPO | Source: Ambulatory Visit | Attending: Internal Medicine | Admitting: Internal Medicine

## 2014-01-10 ENCOUNTER — Encounter (HOSPITAL_COMMUNITY): Payer: Self-pay

## 2014-01-10 ENCOUNTER — Encounter: Payer: Self-pay | Admitting: Internal Medicine

## 2014-01-10 DIAGNOSIS — J449 Chronic obstructive pulmonary disease, unspecified: Secondary | ICD-10-CM | POA: Insufficient documentation

## 2014-01-10 DIAGNOSIS — K219 Gastro-esophageal reflux disease without esophagitis: Secondary | ICD-10-CM | POA: Insufficient documentation

## 2014-01-10 DIAGNOSIS — J4489 Other specified chronic obstructive pulmonary disease: Secondary | ICD-10-CM | POA: Insufficient documentation

## 2014-01-10 DIAGNOSIS — R918 Other nonspecific abnormal finding of lung field: Secondary | ICD-10-CM

## 2014-01-10 DIAGNOSIS — Z79899 Other long term (current) drug therapy: Secondary | ICD-10-CM | POA: Insufficient documentation

## 2014-01-10 DIAGNOSIS — E785 Hyperlipidemia, unspecified: Secondary | ICD-10-CM | POA: Insufficient documentation

## 2014-01-10 DIAGNOSIS — C3490 Malignant neoplasm of unspecified part of unspecified bronchus or lung: Secondary | ICD-10-CM | POA: Insufficient documentation

## 2014-01-10 DIAGNOSIS — R222 Localized swelling, mass and lump, trunk: Secondary | ICD-10-CM | POA: Insufficient documentation

## 2014-01-10 DIAGNOSIS — I10 Essential (primary) hypertension: Secondary | ICD-10-CM | POA: Insufficient documentation

## 2014-01-10 LAB — CBC
HCT: 41.2 % (ref 39.0–52.0)
Hemoglobin: 13.8 g/dL (ref 13.0–17.0)
MCH: 31.7 pg (ref 26.0–34.0)
MCHC: 33.5 g/dL (ref 30.0–36.0)
MCV: 94.7 fL (ref 78.0–100.0)
PLATELETS: 281 10*3/uL (ref 150–400)
RBC: 4.35 MIL/uL (ref 4.22–5.81)
RDW: 12.7 % (ref 11.5–15.5)
WBC: 10.8 10*3/uL — ABNORMAL HIGH (ref 4.0–10.5)

## 2014-01-10 LAB — APTT: aPTT: 26 seconds (ref 24–37)

## 2014-01-10 LAB — PROTIME-INR
INR: 0.85 (ref 0.00–1.49)
Prothrombin Time: 11.5 seconds — ABNORMAL LOW (ref 11.6–15.2)

## 2014-01-10 MED ORDER — MIDAZOLAM HCL 2 MG/2ML IJ SOLN
INTRAMUSCULAR | Status: AC | PRN
  Administered 2014-01-10 (×2): 0.5 mg via INTRAVENOUS
  Administered 2014-01-10: 1 mg via INTRAVENOUS

## 2014-01-10 MED ORDER — FENTANYL CITRATE 0.05 MG/ML IJ SOLN
INTRAMUSCULAR | Status: AC | PRN
  Administered 2014-01-10: 50 ug via INTRAVENOUS
  Administered 2014-01-10 (×2): 25 ug via INTRAVENOUS

## 2014-01-10 MED ORDER — SODIUM CHLORIDE 0.9 % IV SOLN
INTRAVENOUS | Status: DC
  Administered 2014-01-10: 09:00:00 via INTRAVENOUS

## 2014-01-10 MED ORDER — MIDAZOLAM HCL 2 MG/2ML IJ SOLN
INTRAMUSCULAR | Status: AC
  Filled 2014-01-10: qty 6

## 2014-01-10 MED ORDER — FENTANYL CITRATE 0.05 MG/ML IJ SOLN
INTRAMUSCULAR | Status: AC
  Filled 2014-01-10: qty 6

## 2014-01-10 NOTE — H&P (Signed)
Agree.  Patient seen.  For LUL mass biopsy under CT guidance later this morning.  Consent obtained and risks discussed.

## 2014-01-10 NOTE — Discharge Instructions (Signed)
Needle Biopsy of Lung, Care After Refer to this sheet in the next few weeks. These instructions provide you with information on caring for yourself after your procedure. Your health care provider may also give you more specific instructions. Your treatment has been planned according to current medical practices, but problems sometimes occur. Call your health care provider if you have any problems or questions after your procedure. WHAT TO EXPECT AFTER THE PROCEDURE A bandage will be applied over the areas where the needle was inserted. You may be asked to apply pressure to the bandage for several minutes to ensure there is minimal bleeding. In most cases, you can leave when your needle biopsy procedure is completed. Do not drive yourself home. Someone else should take you home. If you received an IV sedative or general anesthetic, you will be taken to a comfortable place to relax while the medication wears off. If you have upcoming travel scheduled, talk to your doctor about when it is safe to travel by air after the procedure. HOME CARE INSTRUCTIONS Expect to take it easy for the rest of the day. Protect the area where you received the needle biopsy by keeping the bandage in place for as long as instructed. You may feel some mild pain or discomfort in the area, but this should stop in a day or two. Only take over-the-counter or prescription medicines for pain, discomfort, or fever as directed by your caregiver. SEEK MEDICAL CARE IF:   You have pain at the biopsy site that worsens or is not helped by medication.  You have swelling or drainage at the needle biopsy site.  You have a fever. SEEK IMMEDIATE MEDICAL CARE IF:   You have new or worsening shortness of breath.  You have chest pain.  You are coughing up blood.  You have bleeding that does not stop with pressure or a bandage.  You develop light-headedness or fainting. Document Released: 05/23/2007 Document Revised: 03/28/2013 Document  Reviewed: 12/18/2012 [facility] Patient Information 2014 [place]. Moderate Sedation, Adult Moderate sedation is given to help you relax or even sleep through a procedure. You may remain sleepy, be clumsy, or have poor balance for several hours following this procedure. Arrange for a responsible adult, family member, or friend to take you home. A responsible adult should stay with you for at least 24 hours or until the medicines have worn off.  Do not participate in any activities where you could become injured for the next 24 hours, or until you feel normal again. Do not:  Drive.  Swim.  Ride a bicycle.  Operate heavy machinery.  Cook.  Use power tools.  Climb ladders.  Work at [employer].  Do not make important decisions or sign legal documents until you are improved.  Vomiting may occur if you eat too soon. When you can drink without vomiting, try water, juice, or soup. Try solid foods if you feel little or no nausea.  Only take over-the-counter or prescription medications for pain, discomfort, or fever as directed by your caregiver.If pain medications have been prescribed for you, ask your caregiver how soon it is safe to take them.  Make sure you and your family fully understands everything about the medication given to you. Make sure you understand what side effects may occur.  You should not drink alcohol, take sleeping pills, or medications that cause drowsiness for at least 24 hours.  If you smoke, do not smoke alone.  If you are feeling better, you may resume normal  activities 24 hours after receiving sedation.  Keep all appointments as scheduled. Follow all instructions.  Ask questions if you do not understand. SEEK MEDICAL CARE IF:   Your skin is pale or bluish in color.  You continue to feel sick to your stomach (nauseous) or throw up (vomit).  Your pain is getting worse and not helped by medication.  You have bleeding or swelling.  You are still  sleepy or feeling clumsy after 24 hours. SEEK IMMEDIATE MEDICAL CARE IF:   You develop a rash.  You have difficulty breathing.  You develop any type of allergic problem.  You have a fever. Document Released: 04/20/2001 Document Revised: 10/18/2011 Document Reviewed: 04/02/2013 [facility] Patient Information 2014 [place].

## 2014-01-10 NOTE — Progress Notes (Signed)
Patient ID: Alex Hart, male   DOB: [birth year], 58 y.o.   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  More tissue requested for more definitive pathologic evaluation.

## 2014-01-10 NOTE — Procedures (Signed)
Procedure:  CT guided core biopsy of LUL lung mass. Findings:  4 cm LUL mass localized.  18 G core biopsy x 3 via posterior 17 G needle.  No immediate complications.

## 2014-01-10 NOTE — H&P (Signed)
Alex Hart is an 58 y.o. male.   Chief Complaint: "I'm having a lung biopsy" HPI: Patient with history of smoking, COPD and recent PET scan revealing hypermetabolic left upper lobe/apex lung masses with mediastinal/abdominal  adenopathy. Recent bronchoscopy of LUL mass revealed atypical cells. He presents today for CT guided left apical lung mass biopsy.  Past Medical History  Diagnosis Date  . Muscle spasm of back   . Blood in stool   . Anxiety   . Arthritis   . Chronic pain syndrome   . Hx of tonsillectomy   . Bronchitis, chronic   . Hypertension   . Hyperlipidemia   . GERD (gastroesophageal reflux disease)   . COPD (chronic obstructive pulmonary disease)     Past Surgical History  Procedure Laterality Date  . Tonsillectomy    . Inguinal hernia repair  1990  . Back surgery  1992  . Abdominal surgery to remove adhesions from colon      teenager  . Video bronchoscopy Bilateral 12/19/2013    Procedure: VIDEO BRONCHOSCOPY WITHOUT FLUORO;  Surgeon: Alex Rockers, MD;  Location: [facility];  Service: Cardiopulmonary;  Laterality: Bilateral;    Family History  Problem Relation Age of Onset  . Cancer Mother   . Stroke Father   . Asthma Sister   . Emphysema Sister    Social History:  reports that he quit smoking about 2 months ago. His smoking use included Cigarettes. He has a 20 pack-year smoking history. He has never used smokeless tobacco. He reports that he does not drink alcohol or use illicit drugs.  Allergies:  Allergies  Allergen Reactions  . Azor [Amlodipine-Olmesartan]     Dizzy   . Codeine     REACTION: itching  . Crestor [Rosuvastatin]     cramps  . Lipitor [Atorvastatin]     cramps  . Lovastatin     cramps  . Pravachol [Pravastatin Sodium]     achy    Current outpatient prescriptions:albuterol (PROVENTIL HFA;VENTOLIN HFA) 108 (90 BASE) MCG/ACT inhaler, Inhale 2 puffs into the lungs every 6 (six) hours as needed for wheezing., Disp: 3 Inhaler, Rfl:  0;  buprenorphine-naloxone (SUBOXONE) 2-0.5 MG SUBL, Place 1 tablet under the tongue daily. , Disp: , Rfl: ;  pantoprazole (PROTONIX) 40 MG tablet, Take 1 tablet (40 mg total) by mouth daily., Disp: 90 tablet, Rfl: 3 Tiotropium Bromide Monohydrate (SPIRIVA RESPIMAT) 2.5 MCG/ACT AERS, 2 puffs each am, Disp: 1 Inhaler, Rfl: 11;  triamterene-hydrochlorothiazide (MAXZIDE-25) 37.5-25 MG per tablet, Take 1 tablet by mouth daily., Disp: 90 tablet, Rfl: 3;  budesonide-formoterol (SYMBICORT) 160-4.5 MCG/ACT inhaler, Inhale 1 puff into the lungs 2 (two) times daily., Disp: , Rfl:  Current facility-administered medications:0.9 %  sodium chloride infusion, , Intravenous, Continuous, Alex Jacob, PA-C, Last Rate: 10 mL/hr at 01/10/14 0905;  fentaNYL (SUBLIMAZE) 0.05 MG/ML injection, , , , ;  midazolam (VERSED) 2 MG/2ML injection, , , ,    Results for orders placed during the hospital encounter of 01/10/14 (from the past 48 hour(s))  APTT     Status: None   Collection Time    01/10/14  9:06 AM      Result Value Ref Range   aPTT 26  24 - 37 seconds  CBC     Status: Abnormal   Collection Time    01/10/14  9:06 AM      Result Value Ref Range   WBC 10.8 (*) 4.0 - 10.5 K/uL   RBC 4.35  4.22 - 5.81 MIL/uL   Hemoglobin 13.8  13.0 - 17.0 g/dL   HCT 41.2  39.0 - 52.0 %   MCV 94.7  78.0 - 100.0 fL   MCH 31.7  26.0 - 34.0 pg   MCHC 33.5  30.0 - 36.0 g/dL   RDW 12.7  11.5 - 15.5 %   Platelets 281  150 - 400 K/uL  PROTIME-INR     Status: Abnormal   Collection Time    01/10/14  9:06 AM      Result Value Ref Range   Prothrombin Time 11.5 (*) 11.6 - 15.2 seconds   INR 0.85  0.00 - 1.49   No results found.  Review of Systems  Constitutional: Negative for fever and chills.  HENT:       Occ HA's  Respiratory: Positive for cough and shortness of breath. Negative for hemoptysis.   Cardiovascular: Negative for chest pain.  Gastrointestinal: Negative for nausea, vomiting and abdominal pain.  Musculoskeletal:  Negative for back pain.  Endo/Heme/Allergies: Does not bruise/bleed easily.    Blood pressure 144/80, pulse 109, temperature 98.5 F (36.9 C), temperature source Oral, resp. rate 20, height 5\' 11"  (1.803 m), weight 206 lb (93.441 kg), SpO2 92.00%. Physical Exam  Constitutional: He is oriented to person, place, and time. He appears well-developed and well-nourished.  Cardiovascular:  Tachy but regular  Respiratory: Effort normal.  Distant BS bilat , more so on left  GI: Soft. Bowel sounds are normal. There is no tenderness.  Musculoskeletal: Normal range of motion. He exhibits no edema.  Neurological: He is alert and oriented to person, place, and time.     Assessment/Plan Patient with history of smoking, COPD and recent PET scan revealing hypermetabolic left upper lobe/apex lung masses with mediastinal/abdominal  adenopathy. Recent bronchoscopy of LUL mass revealed atypical cells. He presents today for CT guided left apical lung mass biopsy. Details/risks of procedure (including but not limited to internal bleeding, infection, pneumothorax/need for chest tube and death) Alex/w pt/wife with their understanding and consent    Alex Hart 01/10/2014, 9:40 AM

## 2014-01-15 ENCOUNTER — Telehealth: Payer: Self-pay | Admitting: Internal Medicine

## 2014-01-15 ENCOUNTER — Other Ambulatory Visit: Payer: Self-pay | Admitting: Internal Medicine

## 2014-01-15 ENCOUNTER — Telehealth: Payer: Self-pay | Admitting: *Deleted

## 2014-01-15 ENCOUNTER — Encounter: Payer: Self-pay | Admitting: Internal Medicine

## 2014-01-15 DIAGNOSIS — R918 Other nonspecific abnormal finding of lung field: Secondary | ICD-10-CM

## 2014-01-15 NOTE — Telephone Encounter (Signed)
Discussed dx, referred to oncology

## 2014-01-15 NOTE — Telephone Encounter (Signed)
Called pt and set up for appt [employer] 01/24/14.  Pt verbalized understanding of appt time and place

## 2014-01-15 NOTE — Telephone Encounter (Signed)
Spoke with the pt He is asking for results of CT Bx  Please advise thanks!

## 2014-01-17 ENCOUNTER — Encounter: Payer: Self-pay | Admitting: Internal Medicine

## 2014-01-17 ENCOUNTER — Ambulatory Visit (INDEPENDENT_AMBULATORY_CARE_PROVIDER_SITE_OTHER): Payer: BC Managed Care – PPO | Admitting: Internal Medicine

## 2014-01-17 VITALS — BP 120/80 | HR 84 | Temp 98.4°F | Resp 16 | Wt 202.0 lb

## 2014-01-17 DIAGNOSIS — J449 Chronic obstructive pulmonary disease, unspecified: Secondary | ICD-10-CM

## 2014-01-17 DIAGNOSIS — R0902 Hypoxemia: Secondary | ICD-10-CM

## 2014-01-17 DIAGNOSIS — J4489 Other specified chronic obstructive pulmonary disease: Secondary | ICD-10-CM

## 2014-01-17 DIAGNOSIS — R918 Other nonspecific abnormal finding of lung field: Secondary | ICD-10-CM

## 2014-01-17 DIAGNOSIS — J9610 Chronic respiratory failure, unspecified whether with hypoxia or hypercapnia: Secondary | ICD-10-CM

## 2014-01-17 DIAGNOSIS — F411 Generalized anxiety disorder: Secondary | ICD-10-CM

## 2014-01-17 DIAGNOSIS — G4734 Idiopathic sleep related nonobstructive alveolar hypoventilation: Secondary | ICD-10-CM

## 2014-01-17 DIAGNOSIS — F17200 Nicotine dependence, unspecified, uncomplicated: Secondary | ICD-10-CM

## 2014-01-17 DIAGNOSIS — I10 Essential (primary) hypertension: Secondary | ICD-10-CM

## 2014-01-17 DIAGNOSIS — R222 Localized swelling, mass and lump, trunk: Secondary | ICD-10-CM

## 2014-01-17 DIAGNOSIS — R29898 Other symptoms and signs involving the musculoskeletal system: Secondary | ICD-10-CM

## 2014-01-17 MED ORDER — PAROXETINE HCL 10 MG PO TABS: 10.0000 mg | ORAL_TABLET | Freq: Every day | ORAL | Status: DC

## 2014-01-17 MED ORDER — ALPRAZOLAM 1 MG PO TABS: 1.0000 mg | ORAL_TABLET | Freq: Three times a day (TID) | ORAL | Status: AC | PRN

## 2014-01-17 MED ORDER — IPRATROPIUM-ALBUTEROL 20-100 MCG/ACT IN AERS: 1.0000 | INHALATION_SPRAY | Freq: Four times a day (QID) | RESPIRATORY_TRACT | Status: DC | PRN

## 2014-01-17 NOTE — Assessment & Plan Note (Signed)
6/15 ?paraneoplasic Onc appt pending

## 2014-01-17 NOTE — Assessment & Plan Note (Signed)
Added Paxil

## 2014-01-17 NOTE — Progress Notes (Signed)
   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 C/o anxiety    The patient presents for a follow-up of  chronic hypertension, chronic dyslipidemia, anxiety, COPD, HTN not controlled with medicines. He states BP is nl at home - he did not take Maxzide. He  quit smoking 1 wk ago   He is more anxious...  BP Readings from Last 3 Encounters:  01/17/14 120/80  01/10/14 127/69  01/07/14 118/60   Wt Readings from Last 3 Encounters:  01/17/14 202 lb (91.627 kg)  01/10/14 206 lb (93.441 kg)  01/07/14 205 lb (92.987 kg)      Review of Systems  Constitutional: Negative for fever, appetite change, fatigue and unexpected weight change.  HENT: Negative for congestion, nosebleeds, sneezing, sore throat and trouble swallowing.   Eyes: Negative for itching and visual disturbance.  Respiratory: Positive for cough.   Cardiovascular: Negative for chest pain, palpitations and leg swelling.  Gastrointestinal: Negative for nausea, diarrhea, blood in stool and abdominal distention.  Genitourinary: Negative for frequency and hematuria.  Musculoskeletal: Negative for back pain, gait problem, joint swelling and neck pain.  Skin: Negative for rash.  Neurological: Negative for dizziness, tremors, speech difficulty and weakness.  Psychiatric/Behavioral: Negative for sleep disturbance, dysphoric mood and agitation. The patient is nervous/anxious.        Objective:   Physical Exam  Constitutional: He is oriented to person, place, and time. He appears well-developed.  HENT:  Mouth/Throat: Oropharynx is clear and moist.  Eyes: Conjunctivae are normal. Pupils are equal, round, and reactive to light.  Neck: Normal range of motion. No JVD present. No thyromegaly present.  Cardiovascular: Normal rate, regular rhythm, normal heart sounds and intact distal pulses.  Exam reveals no gallop and no friction rub.   No murmur  heard. Pulmonary/Chest: Effort normal and breath sounds normal. No respiratory distress. He has no wheezes. He has no rales. He exhibits no tenderness.  Abdominal: Soft. Bowel sounds are normal. He exhibits no distension and no mass. There is no tenderness. There is no rebound and no guarding.  Musculoskeletal: Normal range of motion. He exhibits no edema and no tenderness.  Lymphadenopathy:    He has no cervical adenopathy.  Neurological: He is alert and oriented to person, place, and time. He has normal reflexes. No cranial nerve deficit. He exhibits normal muscle tone. Coordination normal.  Skin: Skin is warm and dry. No rash noted.  Psychiatric: He has a normal mood and affect. His behavior is normal. Judgment and thought content normal.   Lab Results  Component Value Date   WBC 10.8* 01/10/2014   HGB 13.8 01/10/2014   HCT 41.2 01/10/2014   PLT 281 01/10/2014   GLUCOSE 171* 11/12/2013   CHOL 191 06/22/2013   TRIG 58.0 06/22/2013   HDL 41.00 06/22/2013   LDLDIRECT 149.0 02/11/2010   LDLCALC 138* 06/22/2013   ALT 10 11/12/2013   AST 13 11/12/2013   NA 140 11/12/2013   K 4.3 11/12/2013   CL 97 11/12/2013   CREATININE 0.86 11/12/2013   BUN 18 11/12/2013   CO2 28 11/12/2013   TSH 1.06 06/22/2013   PSA 1.37 06/22/2013   INR 0.85 01/10/2014    A complex case    Assessment & Plan:

## 2014-01-17 NOTE — Progress Notes (Signed)
Pre visit review using our clinic review tool, if applicable. No additional management support is needed unless otherwise documented below in the visit note. 

## 2014-01-17 NOTE — Assessment & Plan Note (Signed)
Continue with current prescription therapy as reflected on the Med list.  

## 2014-01-17 NOTE — Assessment & Plan Note (Signed)
Quit 5/15

## 2014-01-17 NOTE — Assessment & Plan Note (Signed)
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

## 2014-01-17 NOTE — Assessment & Plan Note (Signed)
On O2 now

## 2014-01-18 ENCOUNTER — Telehealth: Payer: Self-pay | Admitting: Internal Medicine

## 2014-01-18 NOTE — Telephone Encounter (Signed)
Relevant patient education assigned to patient using Emmi. ° °

## 2014-01-18 NOTE — Telephone Encounter (Signed)
Form completed and copy of path attached  Up front for pick up  [facility] for spouse to be made aware

## 2014-01-23 ENCOUNTER — Other Ambulatory Visit: Payer: Self-pay | Admitting: Medical Oncology

## 2014-01-23 DIAGNOSIS — R918 Other nonspecific abnormal finding of lung field: Secondary | ICD-10-CM

## 2014-01-24 ENCOUNTER — Telehealth: Payer: Self-pay | Admitting: *Deleted

## 2014-01-24 ENCOUNTER — Ambulatory Visit
Admission: RE | Admit: 2014-01-24 | Discharge: 2014-01-24 | Disposition: A | Discharge status: Home / Self Care | Payer: BC Managed Care – PPO | Source: Ambulatory Visit | Attending: Radiation Oncology | Admitting: Radiation Oncology

## 2014-01-24 ENCOUNTER — Other Ambulatory Visit (HOSPITAL_BASED_OUTPATIENT_CLINIC_OR_DEPARTMENT_OTHER): Payer: BC Managed Care – PPO

## 2014-01-24 ENCOUNTER — Encounter: Payer: Self-pay | Admitting: Internal Medicine

## 2014-01-24 ENCOUNTER — Other Ambulatory Visit: Payer: Self-pay | Admitting: Internal Medicine

## 2014-01-24 ENCOUNTER — Ambulatory Visit: Payer: BC Managed Care – PPO | Attending: Internal Medicine | Admitting: Physical Therapy

## 2014-01-24 ENCOUNTER — Ambulatory Visit (HOSPITAL_BASED_OUTPATIENT_CLINIC_OR_DEPARTMENT_OTHER): Payer: BC Managed Care – PPO | Admitting: Internal Medicine

## 2014-01-24 VITALS — BP 159/93 | HR 106 | Temp 98.3°F | Resp 18 | Wt 197.8 lb

## 2014-01-24 VITALS — BP 159/93 | HR 106 | Temp 98.3°F | Resp 18 | Ht 71.0 in | Wt 197.8 lb

## 2014-01-24 DIAGNOSIS — C7A1 Malignant poorly differentiated neuroendocrine tumors: Secondary | ICD-10-CM

## 2014-01-24 DIAGNOSIS — K219 Gastro-esophageal reflux disease without esophagitis: Secondary | ICD-10-CM

## 2014-01-24 DIAGNOSIS — C3492 Malignant neoplasm of unspecified part of left bronchus or lung: Secondary | ICD-10-CM

## 2014-01-24 DIAGNOSIS — R599 Enlarged lymph nodes, unspecified: Secondary | ICD-10-CM

## 2014-01-24 DIAGNOSIS — J449 Chronic obstructive pulmonary disease, unspecified: Secondary | ICD-10-CM

## 2014-01-24 DIAGNOSIS — C3490 Malignant neoplasm of unspecified part of unspecified bronchus or lung: Secondary | ICD-10-CM | POA: Insufficient documentation

## 2014-01-24 DIAGNOSIS — R918 Other nonspecific abnormal finding of lung field: Secondary | ICD-10-CM

## 2014-01-24 HISTORY — DX: Malignant neoplasm of unspecified part of left bronchus or lung: C34.92

## 2014-01-24 LAB — CBC WITH DIFFERENTIAL/PLATELET
BASO%: 1 % (ref 0.0–2.0)
Basophils Absolute: 0.1 10*3/uL (ref 0.0–0.1)
EOS%: 2 % (ref 0.0–7.0)
Eosinophils Absolute: 0.3 10*3/uL (ref 0.0–0.5)
HCT: 44.6 % (ref 38.4–49.9)
HGB: 14.7 g/dL (ref 13.0–17.1)
LYMPH#: 2.6 10*3/uL (ref 0.9–3.3)
LYMPH%: 19.8 % (ref 14.0–49.0)
MCH: 31.5 pg (ref 27.2–33.4)
MCHC: 33 g/dL (ref 32.0–36.0)
MCV: 95.2 fL (ref 79.3–98.0)
MONO#: 1.2 10*3/uL — ABNORMAL HIGH (ref 0.1–0.9)
MONO%: 9 % (ref 0.0–14.0)
NEUT#: 8.8 10*3/uL — ABNORMAL HIGH (ref 1.5–6.5)
NEUT%: 68.2 % (ref 39.0–75.0)
Platelets: 337 10*3/uL (ref 140–400)
RBC: 4.69 10*6/uL (ref 4.20–5.82)
RDW: 13.2 % (ref 11.0–14.6)
WBC: 12.9 10*3/uL — ABNORMAL HIGH (ref 4.0–10.3)

## 2014-01-24 LAB — COMPREHENSIVE METABOLIC PANEL (CC13)
ALK PHOS: 86 U/L (ref 40–150)
ALT: 10 U/L (ref 0–55)
AST: 15 U/L (ref 5–34)
Albumin: 4 g/dL (ref 3.5–5.0)
Anion Gap: 12 mEq/L — ABNORMAL HIGH (ref 3–11)
BUN: 24.6 mg/dL (ref 7.0–26.0)
CO2: 33 mEq/L — ABNORMAL HIGH (ref 22–29)
Calcium: 10.1 mg/dL (ref 8.4–10.4)
Chloride: 94 mEq/L — ABNORMAL LOW (ref 98–109)
Creatinine: 1.2 mg/dL (ref 0.7–1.3)
Glucose: 169 mg/dl — ABNORMAL HIGH (ref 70–140)
Potassium: 4.7 mEq/L (ref 3.5–5.1)
SODIUM: 139 meq/L (ref 136–145)
Total Bilirubin: 0.51 mg/dL (ref 0.20–1.20)
Total Protein: 8 g/dL (ref 6.4–8.3)

## 2014-01-24 MED ORDER — PROCHLORPERAZINE MALEATE 10 MG PO TABS: 10.0000 mg | ORAL_TABLET | Freq: Four times a day (QID) | ORAL | Status: AC | PRN

## 2014-01-24 NOTE — Telephone Encounter (Signed)
Called pt no answer °

## 2014-01-24 NOTE — Progress Notes (Signed)
[place] [phone]   [phone] Multidisciplinary thoracic oncology clinic ([place])  CONSULT NOTE  REFERRING PHYSICIAN: Dr. Christinia Gully  REASON FOR CONSULTATION:  58 years old white male recently diagnosed with lung cancer.  HPI Alex Hart is a 58 y.o. male was past medical history significant for dyslipidemia, chronic pain syndrome, GERD, COPD, hypertension, history of colon polyps, anxiety and anemia as well as long history of smoking. The patient mentions that on 11/11/2013 while mowing the grass he started having chest congestion as well as shortness of breath and fever up to 103. He had chest x-ray performed on 06/14/2014 and it showed new 4.4 CM masslike density at the superior aspect of the left hilum. This raises concern for malignancy. This was followed by CT scan of the chest on 11/12/2013. It showed bulky mediastinal lymphadenopathy with left aortopulmonary window. 4 x 8.7 cm nodal conglomeration extending into the left upper lobe, which appears to correspond to radiographic abnormality. Additional 2.6 cm subpleural left upper lobe pulmonary nodule. Pretracheal 3.4 x 4.1 cm no conglomeration. The patient was referred to Dr. Melvyn Novas. On 12/19/2013 he underwent video flexible fiberoptic bronchoscopy under the care of Dr. Melvyn Novas  and the final pathology (Accession: [id number]) showed markedly atypical cells with significant crush artifacts. A battery of immunostains were performed and the atypical cells are positive for cytokeratin AE1/AE3,synaptophysin, negative for p63, chromogranin, cytokeratin 5/6 and CD45. The overall findings are worrisome for poorly differentiated malignancy with neuroendocrine features. The patient had a PET scan performed on 01/03/2014 and it showed Hypermetabolic masses in the central left upper lobe and medial left  lung apex, suspicious for synchronous primary bronchogenic carcinomas. Bulky left hilar and mediastinal lymph  node metastases.  Mild metastatic abdominal lymphadenopathy in the central small bowel mesentery.  On 01/10/2014 the patient underwent CT-guided core biopsy of the left lung mass. The final pathology (Accession: [id number]) again showed poorly differentiated carcinoma with neuroendocrine features. Two needle core biopsies are available for evaluation. Sections show extensive involvement with poorly differentiated carcinoma with stratified nucleoli arranged in acinar and clusters with associated tumor necrosis. Immunohistochemical stains were performed and the tumor cells show the following immunoprofile: CK 7: strongly positive, CK 20: negative, TTF-1: patchy positive, CDX-2: patchy positive, CD56: positive, Synaptophysin: patchy positive. p63: patchy positive, CK 5/6: negative, Napsin A: negative. The overall findings are mostly consistent with poorly differentiated carcinoma with neuroendocrine features. The immunophenotype of this tumor is quite unusual with positive immunoreactivity to both neuroendocrine markers, TTF-1 and p63. The findings may represent a composite of neuroendocrine carcinoma and poorly differentiated squamous cell carcinoma. Dr. Melvyn Novas kindly referred the patient to me today for evaluation and recommendation regarding treatment of his condition.  When seen today the patient continues to complain of shortness of breath as well as occasional chest pain with cough productive for thick sputum. He lost around 15 pounds in the last 2 months. He denied having any significant hemoptysis. He has no significant headache or visual changes. The patient denied having any significant nausea or vomiting. His family history is significant for a mother who died from heart attack and mother also had heart attack and congestive heart failure. The patient is married and he was accompanied by his wife Alex Hart. He has 2 sons. He was to work as an Clinical biochemist at [employer]. He has a history of smoking 2 packs per  day for around 40 years and unfortunately continues to smoke but trying to quit. I strongly  advise him to quit smoking and offered him smoke cessation program. The patient has no history of alcohol or drug abuse.  HPI  Past Medical History  Diagnosis Date  . Muscle spasm of back   . Blood in stool   . Anxiety   . Arthritis   . Chronic pain syndrome   . Hx of tonsillectomy   . Bronchitis, chronic   . Hypertension   . Hyperlipidemia   . GERD (gastroesophageal reflux disease)   . COPD (chronic obstructive pulmonary disease)   . 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 Tonsillectomy    . Inguinal hernia repair  1990  . Back surgery  1992  . Abdominal surgery to remove adhesions from colon      teenager  . Video bronchoscopy Bilateral 12/19/2013    Procedure: VIDEO BRONCHOSCOPY WITHOUT FLUORO;  Surgeon: Tanda Rockers, MD;  Location: [facility];  Service: Cardiopulmonary;  Laterality: Bilateral;    Family History  Problem Relation Age of Onset  . Cancer Mother   . Stroke Father   . Asthma Sister   . Emphysema Sister     Social History History  Substance Use Topics  . Smoking status: Current Some Day Smoker -- 0.50 packs/day for 40 years    Types: Cigarettes    Last Attempt to Quit: 11/08/2013  . Smokeless tobacco: Never Used  . Alcohol Use: No    Allergies  Allergen Reactions  . Chantix [Varenicline] Nausea And Vomiting  . Azor [Amlodipine-Olmesartan]     Dizzy   . Codeine     REACTION: itching  . Crestor [Rosuvastatin]     cramps  . Lipitor [Atorvastatin]     cramps  . Lovastatin     cramps  . Pravachol [Pravastatin Sodium]     achy    Current Outpatient Prescriptions  Medication Sig Dispense Refill  . ALPRAZolam (XANAX) 1 MG tablet Take 1 tablet (1 mg total) by mouth 3 (three) times daily as needed for anxiety.  90 tablet  2  . buprenorphine-naloxone (SUBOXONE) 2-0.5 MG SUBL Place 1 tablet  under the tongue daily.       . Ipratropium-Albuterol (COMBIVENT RESPIMAT) 20-100 MCG/ACT AERS respimat Inhale 1 puff into the lungs every 6 (six) hours as needed for wheezing or shortness of breath.  3 Inhaler  3  . mometasone-formoterol (DULERA) 100-5 MCG/ACT AERO Inhale 2 puffs into the lungs 2 (two) times daily.      . pantoprazole (PROTONIX) 40 MG tablet Take 1 tablet (40 mg total) by mouth daily.  90 tablet  3  . Tiotropium Bromide Monohydrate (SPIRIVA RESPIMAT) 2.5 MCG/ACT AERS 2 puffs each am  1 Inhaler  11  . triamterene-hydrochlorothiazide (MAXZIDE-25) 37.5-25 MG per tablet Take 1 tablet by mouth daily.  90 tablet  3  . budesonide-formoterol (SYMBICORT) 160-4.5 MCG/ACT inhaler Inhale 1 puff into the lungs 2 (two) times daily.      Marland Kitchen PARoxetine (PAXIL) 10 MG tablet Take 1 tablet (10 mg total) by mouth daily.  30 tablet  5   No current facility-administered medications for this visit.    Review of Systems  Constitutional: positive for fatigue and weight loss Eyes: negative Ears, nose, mouth, throat, and face: negative Respiratory: positive for cough, dyspnea on exertion and sputum Cardiovascular: negative Gastrointestinal: negative Genitourinary:negative Integument/breast: negative Hematologic/lymphatic: negative Musculoskeletal:negative Neurological: negative Behavioral/Psych: negative Endocrine: negative Allergic/Immunologic: negative  Physical Exam  WUJ:WJXBJ, healthy, no  distress, well nourished and well developed SKIN: skin color, texture, turgor are normal, no rashes or significant lesions HEAD: Normocephalic, No masses, lesions, tenderness or abnormalities EYES: normal, PERRLA EARS: External ears normal, Canals clear OROPHARYNX:no exudate and no erythema  NECK: supple, no adenopathy, no JVD LYMPH:  no palpable lymphadenopathy, no hepatosplenomegaly LUNGS: clear to auscultation , and palpation HEART: regular rate & rhythm, no murmurs and no  gallops ABDOMEN:abdomen soft, non-tender, normal bowel sounds and no masses or organomegaly BACK: Back symmetric, no curvature. EXTREMITIES:no joint deformities, effusion, or inflammation, no edema, no skin discoloration  NEURO: alert & oriented x 3 with fluent speech, no focal motor/sensory deficits  PERFORMANCE STATUS: ECOG 1  LABORATORY DATA: Lab Results  Component Value Date   WBC 12.9* 01/24/2014   HGB 14.7 01/24/2014   HCT 44.6 01/24/2014   MCV 95.2 01/24/2014   PLT 337 01/24/2014      Chemistry      Component Value Date/Time   NA 139 01/24/2014 1525   NA 140 11/12/2013 2015   K 4.7 01/24/2014 1525   K 4.3 11/12/2013 2015   CL 97 11/12/2013 2015   CO2 33* 01/24/2014 1525   CO2 28 11/12/2013 2015   BUN 24.6 01/24/2014 1525   BUN 18 11/12/2013 2015   CREATININE 1.2 01/24/2014 1525   CREATININE 0.86 11/12/2013 2015      Component Value Date/Time   CALCIUM 10.1 01/24/2014 1525   CALCIUM 8.9 11/12/2013 2015   ALKPHOS 86 01/24/2014 1525   ALKPHOS 76 11/12/2013 2015   AST 15 01/24/2014 1525   AST 13 11/12/2013 2015   ALT 10 01/24/2014 1525   ALT 10 11/12/2013 2015   BILITOT 0.51 01/24/2014 1525   BILITOT 0.5 11/12/2013 2015       RADIOGRAPHIC STUDIES: Nm Pet Image Initial (pi) Skull Base To Thigh  01/03/2014   CLINICAL DATA:  Initial treatment strategy for left lung mass.  EXAM: NUCLEAR MEDICINE PET SKULL BASE TO THIGH  TECHNIQUE: 10.6 mCi F-18 FDG was injected intravenously. Full-ring PET imaging was performed from the skull base to thigh after the radiotracer. CT data was obtained and used for attenuation correction and anatomic localization.  FASTING BLOOD GLUCOSE:  Value: 154 mg/dl  COMPARISON:  Chest CT on 11/12/2013  FINDINGS: NECK  No hypermetabolic lymph nodes in the neck.  CHEST  Increased size of bilobed mass is seen in the central left upper lobe which also involves the mediastinum and left hilum, now measuring 4.7 x 10 point 1 cm on image 62 compared to 3.9 x 8.7 cm previously. This shows  marked hypermetabolic activity with maximum SUV of 11.6. A separate mass is seen in the medial left lung apex which abuts the posterior mediastinum, which is also increased in size, currently measuring 3.0 x 3.3 cm on image 43 compared to 2.3 x 2.2 cm previously. This mass is hypermetabolic, with SUV max of 10.8.  Extensive hypermetabolic mediastinal lymphadenopathy is demonstrated throughout the right paratracheal, precarinal, AP window, and lateral aortic regions, consistent with metastatic disease. No hypermetabolic lymph nodes seen in the contralateral right hilar region.  ABDOMEN/PELVIS  No abnormal hypermetabolic activity within the liver, pancreas, adrenal glands, or spleen.  Mild hypermetabolic lymphadenopathy is seen within the central small bowel mesentery with index lymph node measuring 1.3 cm with SUV max of 5.8. No other sites of hypermetabolic lymphadenopathy identified within the abdomen or pelvis.  SKELETON  No focal hypermetabolic activity to suggest skeletal metastasis.  IMPRESSION: Hypermetabolic masses  in the central left upper lobe and medial left lung apex, suspicious for synchronous primary bronchogenic carcinomas.  Bulky left hilar and mediastinal lymph node metastases.  Mild metastatic abdominal lymphadenopathy in the central small bowel mesentery.   Electronically Signed   By: Earle Gell M.D.   On: 01/03/2014 16:08   Ct Biopsy  01/10/2014   CLINICAL DATA:  Left lung masses and mediastinal and hilar adenopathy. Previous endobronchial biopsy demonstrated cells worrisome for poorly differentiated malignancy. Additional tissue sampling was requested for more definitive diagnosis.  EXAM: CT GUIDED CORE BIOPSY OF LEFT LUNG MASS  ANESTHESIA/SEDATION: 2.0  Mg IV Versed; 100 mcg IV Fentanyl  Total Moderate Sedation Time: 15 minutes.  PROCEDURE: The procedure risks, benefits, and alternatives were explained to the patient. Questions regarding the procedure were encouraged and answered. The patient  understands and consents to the procedure.  The left posterior chest wall was prepped with Betadine in a sterile fashion, and a sterile drape was applied covering the operative field. A sterile gown and sterile gloves were used for the procedure. Local anesthesia was provided with 1% Lidocaine.  CT was performed in a prone and slightly oblique position. Under CT guidance, a 17 gauge trocar needle was advanced from a left paraspinous approach to the level of a left upper lobe mass. After confirming needle tip position, a total of three 18 gauge core biopsy samples were obtained and submitted in formalin. The outer needle was then removed and additional CT imaging performed.  COMPLICATIONS: Minimal pulmonary hemorrhage.  FINDINGS: Posterior left upper lobe lung mass measures approximately 3.4 x 4.0 cm. Solid tissue was obtained with core biopsy. Post biopsy imaging shows minimal adjacent parenchymal hemorrhage anterior to the mass. No pneumothorax was present.  IMPRESSION: CT-guided core biopsy performed of the left upper lobe lung mass.   Electronically Signed   By: Aletta Edouard M.D.   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 Presented with a large left upper lobe lung mass in addition to mediastinal lymphadenopathy and suspicious metastatic abdominal lymphadenopathy.   PLAN: I had a lengthy discussion with the patient and his wife today about his current disease is stage, prognosis and treatment options. I explained to the patient and his wife that he has incurable condition and on the treatment will be of palliative nature. The patient was given the option of palliative care and hospice referral versus systemic chemotherapy. He is interested  in proceeding with systemic chemotherapy. I recommended for him a course of systemic chemotherapy with cisplatin 60 mg/M2 on day 1 and etoposide at 120 mg/M2 on days 1, 2 and 3 with Neulasta support on day 4. I discussed with the patient adverse effect of the chemotherapy including but not limited to alopecia, myelosuppression, nausea and vomiting, peripheral neuropathy, liver or renal dysfunction as well as hearing deficit. The patient would like to proceed with his treatment as planned and he gave a verbal consent. If he has improvement in his disease with resolution of the abdominal lymphadenopathy on the staging scan after 2 cycles, he would benefit from concurrent radiotherapy to the lung lesions. He was seen later today by Dr. Pablo Ledger for discussion of this option. I will arrange for the patient to have a chemotherapy education class  before starting the first dose of his treatment. I will Escribe Compazine 10 mg by mouth every 6 hours as needed for nausea. He would come back for followup visit in 2 weeks for reevaluation and management any adverse effect of his treatment. He was advised to call immediately if he has any concerning symptoms in the interval. He was seen during his visit today to the multidisciplinary thoracic oncology clinic by medical oncology, radiation oncology, thoracic navigator, social worker, physical therapy.  ADVANCE DIRECTIVES: The patient does not have advanced directives and he was given information packet.  The patient voices understanding of current disease status and treatment options and is in agreement with the current care plan.  All questions were answered. The patient knows to call the clinic with any problems, questions or concerns. We can certainly see the patient much sooner if necessary.  Thank you so much for allowing me to participate in the care of Alex Hart. I will continue to follow up the patient with you and assist in his care.  I spent  55 minutes counseling the patient face to face. The total time spent in the appointment was 80 minutes.  Disclaimer: This note was dictated with voice recognition software. Similar sounding words can inadvertently be transcribed and may not be corrected upon review.   MOHAMED,MOHAMED K. 01/24/2014, 5:21 PM

## 2014-01-24 NOTE — Patient Instructions (Signed)
Smoking Cessation Quitting smoking is important to your health and has many advantages. However, it is not always easy to quit since nicotine is a very addictive drug. Often times, people try 3 times or more before being able to quit. This document explains the best ways for you to prepare to quit smoking. Quitting takes hard work and a lot of effort, but you can do it. ADVANTAGES OF QUITTING SMOKING  You will live longer, feel better, and live better.  Your body will feel the impact of quitting smoking almost immediately.  Within 20 minutes, blood pressure decreases. Your pulse returns to its normal level.  After 8 hours, carbon monoxide levels in the blood return to normal. Your oxygen level increases.  After 24 hours, the chance of having a heart attack starts to decrease. Your breath, hair, and body stop smelling like smoke.  After 48 hours, damaged nerve endings begin to recover. Your sense of taste and smell improve.  After 72 hours, the body is virtually free of nicotine. Your bronchial tubes relax and breathing becomes easier.  After 2 to 12 weeks, lungs can hold more air. Exercise becomes easier and circulation improves.  The risk of having a heart attack, stroke, cancer, or lung disease is greatly reduced.  After 1 year, the risk of coronary heart disease is cut in half.  After 5 years, the risk of stroke falls to the same as a nonsmoker.  After 10 years, the risk of lung cancer is cut in half and the risk of other cancers decreases significantly.  After 15 years, the risk of coronary heart disease drops, usually to the level of a nonsmoker.  If you are pregnant, quitting smoking will improve your chances of having a healthy baby.  The people you live with, especially any children, will be healthier.  You will have extra money to spend on things other than cigarettes. QUESTIONS TO THINK ABOUT BEFORE ATTEMPTING TO QUIT You may want to talk about your answers with your  caregiver.  Why do you want to quit?  If you tried to quit in the past, what helped and what did not?  What will be the most difficult situations for you after you quit? How will you plan to handle them?  Who can help you through the tough times? Your family? Friends? A caregiver?  What pleasures do you get from smoking? What ways can you still get pleasure if you quit? Here are some questions to ask your caregiver:  How can you help me to be successful at quitting?  What medicine do you think would be best for me and how should I take it?  What should I do if I need more help?  What is smoking withdrawal like? How can I get information on withdrawal? GET READY  Set a quit date.  Change your environment by getting rid of all cigarettes, ashtrays, matches, and lighters in your home, car, or work. Do not let people smoke in your home.  Review your past attempts to quit. Think about what worked and what did not. GET SUPPORT AND ENCOURAGEMENT You have a better chance of being successful if you have help. You can get support in many ways.  Tell your family, friends, and co-workers that you are going to quit and need their support. Ask them not to smoke around you.  Get individual, group, or telephone counseling and support. Programs are available at local hospitals and health centers. Call your local health department for   information about programs in your area.  Spiritual beliefs and practices may help some smokers quit.  Download a "quit meter" on your computer to keep track of quit statistics, such as how long you have gone without smoking, cigarettes not smoked, and money saved.  Get a self-help book about quitting smoking and staying off of tobacco. Lodge yourself from urges to smoke. Talk to someone, go for a walk, or occupy your time with a task.  Change your normal routine. Take a different route to work. Drink tea instead of coffee.  Eat breakfast in a different place.  Reduce your stress. Take a hot bath, exercise, or read a book.  Plan something enjoyable to do every day. Reward yourself for not smoking.  Explore interactive web-based programs that specialize in helping you quit. GET MEDICINE AND USE IT CORRECTLY Medicines can help you stop smoking and decrease the urge to smoke. Combining medicine with the above behavioral methods and support can greatly increase your chances of successfully quitting smoking.  Nicotine replacement therapy helps deliver nicotine to your body without the negative effects and risks of smoking. Nicotine replacement therapy includes nicotine gum, lozenges, inhalers, nasal sprays, and skin patches. Some may be available over-the-counter and others require a prescription.  Antidepressant medicine helps people abstain from smoking, but how this works is unknown. This medicine is available by prescription.  Nicotinic receptor partial agonist medicine simulates the effect of nicotine in your brain. This medicine is available by prescription. Ask your caregiver for advice about which medicines to use and how to use them based on your health history. Your caregiver will tell you what side effects to look out for if you choose to be on a medicine or therapy. Carefully read the information on the package. Do not use any other product containing nicotine while using a nicotine replacement product.  RELAPSE OR DIFFICULT SITUATIONS Most relapses occur within the first 3 months after quitting. Do not be discouraged if you start smoking again. Remember, most people try several times before finally quitting. You may have symptoms of withdrawal because your body is used to nicotine. You may crave cigarettes, be irritable, feel very hungry, cough often, get headaches, or have difficulty concentrating. The withdrawal symptoms are only temporary. They are strongest when you first quit, but they will go away within  10-14 days. To reduce the chances of relapse, try to:  Avoid drinking alcohol. Drinking lowers your chances of successfully quitting.  Reduce the amount of caffeine you consume. Once you quit smoking, the amount of caffeine in your body increases and can give you symptoms, such as a rapid heartbeat, sweating, and anxiety.  Avoid smokers because they can make you want to smoke.  Do not let weight gain distract you. Many smokers will gain weight when they quit, usually less than 10 pounds. Eat a healthy diet and stay active. You can always lose the weight gained after you quit.  Find ways to improve your mood other than smoking. FOR MORE INFORMATION  www.smokefree.gov  Document Released: 07/20/2001 Document Revised: 01/25/2012 Document Reviewed: 11/04/2011 [facility] Patient Information 2015 [place], [place]. This information is not intended to replace advice given to you by your health care provider. Make sure you discuss any questions you have with your health care provider.

## 2014-01-25 ENCOUNTER — Other Ambulatory Visit: Payer: Self-pay | Admitting: *Deleted

## 2014-01-25 ENCOUNTER — Telehealth: Payer: Self-pay | Admitting: *Deleted

## 2014-01-25 ENCOUNTER — Telehealth: Payer: Self-pay | Admitting: Internal Medicine

## 2014-01-25 ENCOUNTER — Telehealth: Payer: Self-pay | Admitting: Physician Assistant

## 2014-01-25 ENCOUNTER — Telehealth: Payer: Self-pay | Admitting: Medical Oncology

## 2014-01-25 DIAGNOSIS — F40240 Claustrophobia: Secondary | ICD-10-CM

## 2014-01-25 MED ORDER — LORAZEPAM 1 MG PO TABS: 1.0000 mg | ORAL_TABLET | Freq: Once | ORAL | Status: DC

## 2014-01-25 NOTE — Telephone Encounter (Signed)
s.lw. pt and advised on appts.Marland KitchenMarland Kitchenpt has mychart and will get appt from there

## 2014-01-25 NOTE — Telephone Encounter (Signed)
Per staff phone call and POF I have schedueld appts. Scheduler advised of appts.  JMW  

## 2014-01-25 NOTE — Telephone Encounter (Addendum)
For Claustrophobia , per Dr Julien Nordmann, I called in rx for #1 ativan tablet prior to MRI. Pt notified.

## 2014-01-25 NOTE — Progress Notes (Signed)
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 male  who presented to the emergency room with cough and fever. A chest x-ray showed a left hilar mass. A CT scan showed a 4 x 8.7 cm left AP window enlarged lymph node as well as a 3 cm left upper lobe nodule. Bronchoscopy and biopsy was performed on 513 which showed atypical cells. 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 A PET scan was performed on 01/03/2014 which showed the left upper lobe mass now measuring 4.7 x 10.1 cm with an SUV of 11.6. The left upper lobe mass to 3.0 x 3.3 cm with an SUV of 10.8. Extensive mediastinal lymphadenopathy was noted. It lymphadenopathy was also seen in the small bowel with an index lymph node measuring 1.3 and an SUV of 5.8. No brain imaging has been performed. He was then referred to multidisciplinary thoracic clinic where he presents today with his wife. He reports stable shortness of breath. He also reports weight loss and some hoarseness. His cough continues. He has no headaches, confusion, seizures or focal weakness. He is anxious to proceed on with treatment as soon as possible.  PREVIOUS RADIATION THERAPY: No  PAST MEDICAL HISTORY:  has a past medical history of Muscle spasm of back; Blood in stool; Anxiety; Arthritis; Chronic pain syndrome; tonsillectomy; Bronchitis, chronic; Hypertension; Hyperlipidemia; GERD (gastroesophageal reflux  disease); COPD (chronic obstructive pulmonary disease); and Small cell carcinoma of left lung (01/24/2014).    PAST SURGICAL HISTORY: Past Surgical History  Procedure Laterality Date  . Tonsillectomy    . Inguinal hernia repair  1990  . Back surgery  1992  . Abdominal surgery to remove adhesions from colon      teenager  . Video bronchoscopy Bilateral 12/19/2013    Procedure: VIDEO BRONCHOSCOPY WITHOUT FLUORO;  Surgeon: Tanda Rockers, MD;  Location: [facility];  Service: Cardiopulmonary;  Laterality: Bilateral;    FAMILY HISTORY:  Family History  Problem Relation Age of Onset  . Cancer Mother   . Stroke Father   . Asthma Sister   . Emphysema Sister     SOCIAL HISTORY:  History  Substance Use Topics  . Smoking status: Current Some Day Smoker -- 0.50 packs/day for 40 years    Types: Cigarettes    Last Attempt to Quit: 11/08/2013  . Smokeless tobacco: Never Used  . Alcohol Use: No    ALLERGIES: Chantix; Azor; Codeine; Crestor; Lipitor; Lovastatin; and Pravachol  MEDICATIONS:  Current Outpatient Prescriptions  Medication Sig Dispense Refill  . ALPRAZolam (XANAX) 1 MG tablet Take 1 tablet (1 mg total) by mouth 3 (three) times daily as needed for anxiety.  90 tablet  2  . budesonide-formoterol (SYMBICORT) 160-4.5 MCG/ACT inhaler Inhale 1 puff into the lungs 2 (two) times daily.      . buprenorphine-naloxone (SUBOXONE) 2-0.5 MG SUBL Place 1 tablet under the tongue daily.       Marland Kitchen  COMBIVENT RESPIMAT 20-100 MCG/ACT AERS respimat INHALE 1 PUFF INTO THE LUNGS EVERY 6 HOURS  1 Inhaler  11  . mometasone-formoterol (DULERA) 100-5 MCG/ACT AERO Inhale 2 puffs into the lungs 2 (two) times daily.      . pantoprazole (PROTONIX) 40 MG tablet Take 1 tablet (40 mg total) by mouth daily.  90 tablet  3  . PARoxetine (PAXIL) 10 MG tablet Take 1 tablet (10 mg total) by mouth daily.  30 tablet  5  . prochlorperazine (COMPAZINE) 10 MG tablet Take 1 tablet (10 mg total) by mouth every 6 (six) hours  as needed for nausea or vomiting.  60 tablet  0  . Tiotropium Bromide Monohydrate (SPIRIVA RESPIMAT) 2.5 MCG/ACT AERS 2 puffs each am  1 Inhaler  11  . triamterene-hydrochlorothiazide (MAXZIDE-25) 37.5-25 MG per tablet Take 1 tablet by mouth daily.  90 tablet  3   No current facility-administered medications for this encounter.    REVIEW OF SYSTEMS:  A 15 point review of systems is documented in the electronic medical record. This was obtained by the nursing staff. However, I reviewed this with the patient to discuss relevant findings and make appropriate changes.  Pertinent items are noted in HPI.  PHYSICAL EXAM:  Filed Vitals:   01/24/14 1606  BP: 159/93  Pulse: 106  Temp: 98.3 F (36.8 C)  Resp: 18  .197 lb 12.8 oz (89.721 kg). Ill-appearing male who appears older than his stated age sitting on a chair. Normal respiratory effort. Nasal cannula in place.   LABORATORY DATA:  Lab Results  Component Value Date   WBC 12.9* 01/24/2014   HGB 14.7 01/24/2014   HCT 44.6 01/24/2014   MCV 95.2 01/24/2014   PLT 337 01/24/2014   Lab Results  Component Value Date   NA 139 01/24/2014   K 4.7 01/24/2014   CL 97 11/12/2013   CO2 33* 01/24/2014   Lab Results  Component Value Date   ALT 10 01/24/2014   AST 15 01/24/2014   ALKPHOS 86 01/24/2014   BILITOT 0.51 01/24/2014     RADIOGRAPHY: Nm Pet Image Initial (pi) Skull Base To Thigh  01/03/2014   CLINICAL DATA:  Initial treatment strategy for left lung mass.  EXAM: NUCLEAR MEDICINE PET SKULL BASE TO THIGH  TECHNIQUE: 10.6 mCi F-18 FDG was injected intravenously. Full-ring PET imaging was performed from the skull base to thigh after the radiotracer. CT data was obtained and used for attenuation correction and anatomic localization.  FASTING BLOOD GLUCOSE:  Value: 154 mg/dl  COMPARISON:  Chest CT on 11/12/2013  FINDINGS: NECK  No hypermetabolic lymph nodes in the neck.  CHEST  Increased size of bilobed mass is seen in the central left upper lobe which also  involves the mediastinum and left hilum, now measuring 4.7 x 10 point 1 cm on image 62 compared to 3.9 x 8.7 cm previously. This shows marked hypermetabolic activity with maximum SUV of 11.6. A separate mass is seen in the medial left lung apex which abuts the posterior mediastinum, which is also increased in size, currently measuring 3.0 x 3.3 cm on image 43 compared to 2.3 x 2.2 cm previously. This mass is hypermetabolic, with SUV max of 10.8.  Extensive hypermetabolic mediastinal lymphadenopathy is demonstrated throughout the right paratracheal, precarinal, AP window, and lateral aortic regions, consistent with metastatic disease. No hypermetabolic lymph nodes seen in the contralateral right hilar region.  ABDOMEN/PELVIS  No abnormal hypermetabolic activity within the liver, pancreas, adrenal glands, or spleen.  Mild hypermetabolic lymphadenopathy is seen within the central small bowel mesentery with index lymph node measuring 1.3 cm with SUV max of 5.8. No other sites of hypermetabolic lymphadenopathy identified within the abdomen or pelvis.  SKELETON  No focal hypermetabolic activity to suggest skeletal metastasis.  IMPRESSION: Hypermetabolic masses in the central left upper lobe and medial left lung apex, suspicious for synchronous primary bronchogenic carcinomas.  Bulky left hilar and mediastinal lymph node metastases.  Mild metastatic abdominal lymphadenopathy in the central small bowel mesentery.   Electronically Signed   By: Earle Gell M.D.   On: 01/03/2014 16:08   Ct Biopsy  01/10/2014   CLINICAL DATA:  Left lung masses and mediastinal and hilar adenopathy. Previous endobronchial biopsy demonstrated cells worrisome for poorly differentiated malignancy. Additional tissue sampling was requested for more definitive diagnosis.  EXAM: CT GUIDED CORE BIOPSY OF LEFT LUNG MASS  ANESTHESIA/SEDATION: 2.0  Mg IV Versed; 100 mcg IV Fentanyl  Total Moderate Sedation Time: 15 minutes.  PROCEDURE: The procedure  risks, benefits, and alternatives were explained to the patient. Questions regarding the procedure were encouraged and answered. The patient understands and consents to the procedure.  The left posterior chest wall was prepped with Betadine in a sterile fashion, and a sterile drape was applied covering the operative field. A sterile gown and sterile gloves were used for the procedure. Local anesthesia was provided with 1% Lidocaine.  CT was performed in a prone and slightly oblique position. Under CT guidance, a 17 gauge trocar needle was advanced from a left paraspinous approach to the level of a left upper lobe mass. After confirming needle tip position, a total of three 18 gauge core biopsy samples were obtained and submitted in formalin. The outer needle was then removed and additional CT imaging performed.  COMPLICATIONS: Minimal pulmonary hemorrhage.  FINDINGS: Posterior left upper lobe lung mass measures approximately 3.4 x 4.0 cm. Solid tissue was obtained with core biopsy. Post biopsy imaging shows minimal adjacent parenchymal hemorrhage anterior to the mass. No pneumothorax was present.  IMPRESSION: CT-guided core biopsy performed of the left upper lobe lung mass.   Electronically Signed   By: Aletta Edouard M.D.   On: 01/10/2014 13:23      IMPRESSION: 58 year old male with newly diagnosed small cell lung cancer (possibly limited stage but with mesenteric lymph nodes)  PLAN: I spoke a patient and his wife today. We discussed proceeding on with chemotherapy first given his performance status and the extent of disease. Currently the extent of disease in his chest would preclude radiation treatments. We then discussed treatment of the chest. It is almost impossible biopsy was used lymph nodes in his abdomen. We'll just have to see what they do on chemotherapy. We discussed the process of simulation the placement tattoos. We discussed 60 gray to the chest and the possibility of fatigue and dysphasia. I  will plan on seeing him back as he proceeds through chemotherapy if Dr. Earlie Server feels there is a role for radiation. He will also complete his staging with an MRI of the brain.  I spent 40 minutes  face to face with the patient and more than 50% of that time was spent in counseling and/or coordination of care.   ------------------------------------------------  Thea Silversmith, MD

## 2014-01-25 NOTE — Telephone Encounter (Signed)
cld & spoke to pt and adv fin asst appt @9am  before chemo edu. Pt stated has copy of sch-pt understood

## 2014-01-28 ENCOUNTER — Other Ambulatory Visit: Payer: BC Managed Care – PPO

## 2014-01-28 ENCOUNTER — Encounter: Payer: Self-pay | Admitting: *Deleted

## 2014-01-28 ENCOUNTER — Ambulatory Visit: Payer: BC Managed Care – PPO

## 2014-01-28 ENCOUNTER — Encounter: Payer: Self-pay | Admitting: Internal Medicine

## 2014-01-28 NOTE — Progress Notes (Signed)
Spent time with patient and his wife during chemo class.  Patient comfortable and on portable O2.  Reviewed side effects of Cisplatin and VP16 including but not limited to myelosuppression, nausea and vomiting, fatigue, neuropathy, ototoxicity, eating hints, and increasing fluid intake.  Patient and wife asked good questions and questions were answered. teachback done.

## 2014-01-28 NOTE — Progress Notes (Signed)
Checked in new pt with no financial concerns. °

## 2014-01-29 ENCOUNTER — Other Ambulatory Visit (HOSPITAL_BASED_OUTPATIENT_CLINIC_OR_DEPARTMENT_OTHER): Payer: BC Managed Care – PPO

## 2014-01-29 ENCOUNTER — Ambulatory Visit (HOSPITAL_BASED_OUTPATIENT_CLINIC_OR_DEPARTMENT_OTHER): Payer: BC Managed Care – PPO

## 2014-01-29 VITALS — BP 162/81 | HR 107 | Temp 97.7°F

## 2014-01-29 DIAGNOSIS — Z5111 Encounter for antineoplastic chemotherapy: Secondary | ICD-10-CM

## 2014-01-29 DIAGNOSIS — C3492 Malignant neoplasm of unspecified part of left bronchus or lung: Secondary | ICD-10-CM

## 2014-01-29 DIAGNOSIS — C7A1 Malignant poorly differentiated neuroendocrine tumors: Secondary | ICD-10-CM

## 2014-01-29 LAB — CBC WITH DIFFERENTIAL/PLATELET
BASO%: 0.6 % (ref 0.0–2.0)
Basophils Absolute: 0.1 10*3/uL (ref 0.0–0.1)
EOS ABS: 0.3 10*3/uL (ref 0.0–0.5)
EOS%: 2.4 % (ref 0.0–7.0)
HCT: 41.6 % (ref 38.4–49.9)
HGB: 13.9 g/dL (ref 13.0–17.1)
LYMPH%: 23.7 % (ref 14.0–49.0)
MCH: 31.7 pg (ref 27.2–33.4)
MCHC: 33.4 g/dL (ref 32.0–36.0)
MCV: 95 fL (ref 79.3–98.0)
MONO#: 1 10*3/uL — ABNORMAL HIGH (ref 0.1–0.9)
MONO%: 8.9 % (ref 0.0–14.0)
NEUT%: 64.4 % (ref 39.0–75.0)
NEUTROS ABS: 7.5 10*3/uL — AB (ref 1.5–6.5)
NRBC: 0 % (ref 0–0)
PLATELETS: 258 10*3/uL (ref 140–400)
RBC: 4.38 10*6/uL (ref 4.20–5.82)
RDW: 12.7 % (ref 11.0–14.6)
WBC: 11.7 10*3/uL — ABNORMAL HIGH (ref 4.0–10.3)
lymph#: 2.8 10*3/uL (ref 0.9–3.3)

## 2014-01-29 LAB — COMPREHENSIVE METABOLIC PANEL (CC13)
ALT: 8 U/L (ref 0–55)
ANION GAP: 11 meq/L (ref 3–11)
AST: 12 U/L (ref 5–34)
Albumin: 3.7 g/dL (ref 3.5–5.0)
Alkaline Phosphatase: 81 U/L (ref 40–150)
BILIRUBIN TOTAL: 0.39 mg/dL (ref 0.20–1.20)
BUN: 19.3 mg/dL (ref 7.0–26.0)
CO2: 33 mEq/L — ABNORMAL HIGH (ref 22–29)
CREATININE: 1 mg/dL (ref 0.7–1.3)
Calcium: 9.6 mg/dL (ref 8.4–10.4)
Chloride: 93 mEq/L — ABNORMAL LOW (ref 98–109)
Glucose: 234 mg/dl — ABNORMAL HIGH (ref 70–140)
Potassium: 4.1 mEq/L (ref 3.5–5.1)
Sodium: 137 mEq/L (ref 136–145)
Total Protein: 7.2 g/dL (ref 6.4–8.3)

## 2014-01-29 LAB — MAGNESIUM (CC13): Magnesium: 1.9 mg/dl (ref 1.5–2.5)

## 2014-01-29 MED ORDER — DEXAMETHASONE SODIUM PHOSPHATE 20 MG/5ML IJ SOLN
12.0000 mg | Freq: Once | INTRAMUSCULAR | Status: AC
  Administered 2014-01-29: 12 mg via INTRAVENOUS

## 2014-01-29 MED ORDER — PALONOSETRON HCL INJECTION 0.25 MG/5ML
INTRAVENOUS | Status: AC
  Filled 2014-01-29: qty 5

## 2014-01-29 MED ORDER — POTASSIUM CHLORIDE 2 MEQ/ML IV SOLN
Freq: Once | INTRAVENOUS | Status: AC
  Administered 2014-01-29: 09:00:00 via INTRAVENOUS
  Filled 2014-01-29: qty 10

## 2014-01-29 MED ORDER — DEXAMETHASONE SODIUM PHOSPHATE 20 MG/5ML IJ SOLN
INTRAMUSCULAR | Status: AC
  Filled 2014-01-29: qty 5

## 2014-01-29 MED ORDER — PALONOSETRON HCL INJECTION 0.25 MG/5ML
0.2500 mg | Freq: Once | INTRAVENOUS | Status: AC
  Administered 2014-01-29: 0.25 mg via INTRAVENOUS

## 2014-01-29 MED ORDER — SODIUM CHLORIDE 0.9 % IV SOLN
60.0000 mg/m2 | Freq: Once | INTRAVENOUS | Status: AC
  Administered 2014-01-29: 127 mg via INTRAVENOUS
  Filled 2014-01-29: qty 127

## 2014-01-29 MED ORDER — SODIUM CHLORIDE 0.9 % IV SOLN
150.0000 mg | Freq: Once | INTRAVENOUS | Status: AC
  Administered 2014-01-29: 150 mg via INTRAVENOUS
  Filled 2014-01-29: qty 5

## 2014-01-29 MED ORDER — SODIUM CHLORIDE 0.9 % IV SOLN
Freq: Once | INTRAVENOUS | Status: AC
  Administered 2014-01-29: 09:00:00 via INTRAVENOUS

## 2014-01-29 MED ORDER — SODIUM CHLORIDE 0.9 % IV SOLN
120.0000 mg/m2 | Freq: Once | INTRAVENOUS | Status: AC
  Administered 2014-01-29: 250 mg via INTRAVENOUS
  Filled 2014-01-29: qty 12.5

## 2014-01-29 NOTE — Patient Instructions (Signed)
[facility] Discharge Instructions for Patients Receiving Chemotherapy  Today you received the following chemotherapy agents cisplatin/etoposide.   To help prevent nausea and vomiting after your treatment, we encourage you to take your nausea medication as directed.    If you develop nausea and vomiting that is not controlled by your nausea medication, call the clinic.   BELOW ARE SYMPTOMS THAT SHOULD BE REPORTED IMMEDIATELY:  *FEVER GREATER THAN 100.5 F  *CHILLS WITH OR WITHOUT FEVER  NAUSEA AND VOMITING THAT IS NOT CONTROLLED WITH YOUR NAUSEA MEDICATION  *UNUSUAL SHORTNESS OF BREATH  *UNUSUAL BRUISING OR BLEEDING  TENDERNESS IN MOUTH AND THROAT WITH OR WITHOUT PRESENCE OF ULCERS  *URINARY PROBLEMS  *BOWEL PROBLEMS  UNUSUAL RASH Items with * indicate a potential emergency and should be followed up as soon as possible.  Feel free to call the clinic you have any questions or concerns. The clinic phone number is [phone].

## 2014-01-30 ENCOUNTER — Ambulatory Visit (HOSPITAL_BASED_OUTPATIENT_CLINIC_OR_DEPARTMENT_OTHER): Payer: BC Managed Care – PPO

## 2014-01-30 VITALS — BP 152/75 | HR 120 | Temp 98.6°F | Resp 18

## 2014-01-30 DIAGNOSIS — C7A1 Malignant poorly differentiated neuroendocrine tumors: Secondary | ICD-10-CM

## 2014-01-30 DIAGNOSIS — C3492 Malignant neoplasm of unspecified part of left bronchus or lung: Secondary | ICD-10-CM

## 2014-01-30 DIAGNOSIS — Z5111 Encounter for antineoplastic chemotherapy: Secondary | ICD-10-CM

## 2014-01-30 MED ORDER — DEXAMETHASONE SODIUM PHOSPHATE 10 MG/ML IJ SOLN
10.0000 mg | Freq: Once | INTRAMUSCULAR | Status: AC
  Administered 2014-01-30: 10 mg via INTRAVENOUS

## 2014-01-30 MED ORDER — SODIUM CHLORIDE 0.9 % IV SOLN
120.0000 mg/m2 | Freq: Once | INTRAVENOUS | Status: AC
  Administered 2014-01-30: 250 mg via INTRAVENOUS
  Filled 2014-01-30: qty 12.5

## 2014-01-30 MED ORDER — SODIUM CHLORIDE 0.9 % IV SOLN
Freq: Once | INTRAVENOUS | Status: AC
  Administered 2014-01-30: 10:00:00 via INTRAVENOUS

## 2014-01-30 NOTE — Patient Instructions (Signed)
[facility] Discharge Instructions for Patients Receiving Chemotherapy  Today you received the following chemotherapy agents: etoposide  To help prevent nausea and vomiting after your treatment, we encourage you to take your nausea medication as prescribed.    If you develop nausea and vomiting that is not controlled by your nausea medication, call the clinic.   BELOW ARE SYMPTOMS THAT SHOULD BE REPORTED IMMEDIATELY:  *FEVER GREATER THAN 100.5 F  *CHILLS WITH OR WITHOUT FEVER  NAUSEA AND VOMITING THAT IS NOT CONTROLLED WITH YOUR NAUSEA MEDICATION  *UNUSUAL SHORTNESS OF BREATH  *UNUSUAL BRUISING OR BLEEDING  TENDERNESS IN MOUTH AND THROAT WITH OR WITHOUT PRESENCE OF ULCERS  *URINARY PROBLEMS  *BOWEL PROBLEMS  UNUSUAL RASH Items with * indicate a potential emergency and should be followed up as soon as possible.  Feel free to call the clinic you have any questions or concerns. The clinic phone number is [phone].

## 2014-01-31 ENCOUNTER — Ambulatory Visit (HOSPITAL_BASED_OUTPATIENT_CLINIC_OR_DEPARTMENT_OTHER): Payer: BC Managed Care – PPO

## 2014-01-31 VITALS — BP 156/90 | HR 138 | Temp 98.4°F

## 2014-01-31 DIAGNOSIS — Z5111 Encounter for antineoplastic chemotherapy: Secondary | ICD-10-CM

## 2014-01-31 DIAGNOSIS — C3492 Malignant neoplasm of unspecified part of left bronchus or lung: Secondary | ICD-10-CM

## 2014-01-31 DIAGNOSIS — C7A1 Malignant poorly differentiated neuroendocrine tumors: Secondary | ICD-10-CM

## 2014-01-31 MED ORDER — SODIUM CHLORIDE 0.9 % IV SOLN
120.0000 mg/m2 | Freq: Once | INTRAVENOUS | Status: AC
  Administered 2014-01-31: 250 mg via INTRAVENOUS
  Filled 2014-01-31: qty 12.5

## 2014-01-31 MED ORDER — DEXAMETHASONE SODIUM PHOSPHATE 10 MG/ML IJ SOLN
INTRAMUSCULAR | Status: AC
  Filled 2014-01-31: qty 1

## 2014-01-31 MED ORDER — SODIUM CHLORIDE 0.9 % IV SOLN
Freq: Once | INTRAVENOUS | Status: AC
  Administered 2014-01-31: 10:00:00 via INTRAVENOUS

## 2014-01-31 MED ORDER — DEXAMETHASONE SODIUM PHOSPHATE 10 MG/ML IJ SOLN
10.0000 mg | Freq: Once | INTRAMUSCULAR | Status: AC
  Administered 2014-01-31: 10 mg via INTRAVENOUS

## 2014-01-31 NOTE — Patient Instructions (Addendum)
[facility] Discharge Instructions for Patients Receiving Chemotherapy  Today you received the following chemotherapy agents VP 16.  To help prevent nausea and vomiting after your treatment, we encourage you to take your nausea medication compazine 10 mg every 6 hours as needed.   If you develop nausea and vomiting that is not controlled by your nausea medication, call the clinic.   BELOW ARE SYMPTOMS THAT SHOULD BE REPORTED IMMEDIATELY:  *FEVER GREATER THAN 100.5 F  *CHILLS WITH OR WITHOUT FEVER  NAUSEA AND VOMITING THAT IS NOT CONTROLLED WITH YOUR NAUSEA MEDICATION  *UNUSUAL SHORTNESS OF BREATH  *UNUSUAL BRUISING OR BLEEDING  TENDERNESS IN MOUTH AND THROAT WITH OR WITHOUT PRESENCE OF ULCERS  *URINARY PROBLEMS  *BOWEL PROBLEMS  UNUSUAL RASH Items with * indicate a potential emergency and should be followed up as soon as possible.  Feel free to call the clinic you have any questions or concerns. The clinic phone number is [phone].

## 2014-02-01 ENCOUNTER — Telehealth: Payer: Self-pay | Admitting: *Deleted

## 2014-02-01 ENCOUNTER — Ambulatory Visit (HOSPITAL_BASED_OUTPATIENT_CLINIC_OR_DEPARTMENT_OTHER): Payer: BC Managed Care – PPO

## 2014-02-01 VITALS — BP 117/82 | HR 123 | Temp 98.0°F

## 2014-02-01 DIAGNOSIS — C3492 Malignant neoplasm of unspecified part of left bronchus or lung: Secondary | ICD-10-CM

## 2014-02-01 DIAGNOSIS — C7A1 Malignant poorly differentiated neuroendocrine tumors: Secondary | ICD-10-CM

## 2014-02-01 DIAGNOSIS — Z5189 Encounter for other specified aftercare: Secondary | ICD-10-CM

## 2014-02-01 MED ORDER — PEGFILGRASTIM INJECTION 6 MG/0.6ML
6.0000 mg | Freq: Once | SUBCUTANEOUS | Status: AC
  Administered 2014-02-01: 6 mg via SUBCUTANEOUS
  Filled 2014-02-01: qty 0.6

## 2014-02-01 NOTE — Patient Instructions (Signed)
Alex Hart injection What is this medicine? Alex Hart (peg fil GRA stim) helps the body make more white blood cells. It is used to prevent infection in people with low amounts of white blood cells following cancer treatment. This medicine may be used for other purposes; ask your health care Alex Hart or pharmacist if you have questions. COMMON BRAND NAME(S): Neulasta What should I tell my health care Alex Hart before I take this medicine? They need to know if you have any of these conditions: -sickle cell disease -an unusual or allergic reaction to Alex Hart, filgrastim, E.coli protein, other medicines, foods, dyes, or preservatives -pregnant or trying to get pregnant -breast-feeding How should I use this medicine? This medicine is for injection under the skin. It is usually given by a health care professional in a hospital or clinic setting. If you get this medicine at home, you will be taught how to prepare and give this medicine. Do not shake this medicine. Use exactly as directed. Take your medicine at regular intervals. Do not take your medicine more often than directed. It is important that you put your used needles and syringes in a special sharps container. Do not put them in a trash can. If you do not have a sharps container, call your pharmacist or healthcare Alex Hart to get one. Talk to your pediatrician regarding the use of this medicine in children. While this drug may be prescribed for children who weigh more than 45 kg for selected conditions, precautions do apply Overdosage: If you think you have taken too much of this medicine contact a poison control center or emergency room at once. NOTE: This medicine is only for you. Do not share this medicine with others. What if I miss a dose? If you miss a dose, take it as soon as you can. If it is almost time for your next dose, take only that dose. Do not take double or extra doses. What may interact with this  medicine? -lithium -medicines for growth therapy This list may not describe all possible interactions. Give your health care Alex Hart a list of all the medicines, herbs, non-prescription drugs, or dietary supplements you use. Also tell them if you smoke, drink alcohol, or use illegal drugs. Some items may interact with your medicine. What should I watch for while using this medicine? Visit your doctor for regular check ups. You will need important blood work done while you are taking this medicine. What side effects may I notice from receiving this medicine? Side effects that you should report to your doctor or health care professional as soon as possible: -allergic reactions like skin rash, itching or hives, swelling of the face, lips, or tongue -breathing problems -fever -pain, redness, or swelling where injected -shoulder pain -stomach or side pain Side effects that usually do not require medical attention (report to your doctor or health care professional if they continue or are bothersome): -aches, pains -headache -loss of appetite -nausea, vomiting -unusually tired This list may not describe all possible side effects. Call your doctor for medical advice about side effects. You may report side effects to FDA at 1-800-FDA-1088. Where should I keep my medicine? Keep out of the reach of children. Store in a refrigerator between 2 and 8 degrees C (36 and 46 degrees F). Do not freeze. Keep in carton to protect from light. Throw away this medicine if it is left out of the refrigerator for more than 48 hours. Throw away any unused medicine after the expiration date. NOTE: This sheet is  a summary. It may not cover all possible information. If you have questions about this medicine, talk to your doctor, pharmacist, or health care Alex Hart.  2015, Elsevier/Gold Standard. (2008-02-26 15:41:44)

## 2014-02-01 NOTE — Telephone Encounter (Signed)
Draco here for Neulasta injection following 1st cddp/vp chemo treatment.  States that he is doing well.  No nausea, vomiting or diarrhea.  Is drinking lots of fluids.  All questions answered.  Knows to call if he has any problems or concerns.

## 2014-02-05 ENCOUNTER — Encounter: Payer: Self-pay | Admitting: Internal Medicine

## 2014-02-05 ENCOUNTER — Telehealth: Payer: Self-pay | Admitting: Physician Assistant

## 2014-02-05 ENCOUNTER — Ambulatory Visit (HOSPITAL_COMMUNITY)
Admission: RE | Admit: 2014-02-05 | Discharge: 2014-02-05 | Disposition: A | Discharge status: Home / Self Care | Payer: BC Managed Care – PPO | Source: Ambulatory Visit | Attending: Internal Medicine | Admitting: Internal Medicine

## 2014-02-05 ENCOUNTER — Other Ambulatory Visit (HOSPITAL_BASED_OUTPATIENT_CLINIC_OR_DEPARTMENT_OTHER): Payer: BC Managed Care – PPO

## 2014-02-05 ENCOUNTER — Ambulatory Visit (HOSPITAL_BASED_OUTPATIENT_CLINIC_OR_DEPARTMENT_OTHER): Payer: BC Managed Care – PPO | Admitting: Physician Assistant

## 2014-02-05 ENCOUNTER — Encounter: Payer: Self-pay | Admitting: Physician Assistant

## 2014-02-05 VITALS — BP 125/87 | HR 138 | Temp 98.3°F | Resp 18 | Ht 71.0 in | Wt 195.6 lb

## 2014-02-05 DIAGNOSIS — C3490 Malignant neoplasm of unspecified part of unspecified bronchus or lung: Secondary | ICD-10-CM | POA: Insufficient documentation

## 2014-02-05 DIAGNOSIS — C3492 Malignant neoplasm of unspecified part of left bronchus or lung: Secondary | ICD-10-CM

## 2014-02-05 DIAGNOSIS — C7A1 Malignant poorly differentiated neuroendocrine tumors: Secondary | ICD-10-CM

## 2014-02-05 DIAGNOSIS — R930 Abnormal findings on diagnostic imaging of skull and head, not elsewhere classified: Secondary | ICD-10-CM | POA: Insufficient documentation

## 2014-02-05 LAB — CBC WITH DIFFERENTIAL/PLATELET
BASO%: 0.8 % (ref 0.0–2.0)
Basophils Absolute: 0 10*3/uL (ref 0.0–0.1)
EOS%: 3.8 % (ref 0.0–7.0)
Eosinophils Absolute: 0.1 10*3/uL (ref 0.0–0.5)
HEMATOCRIT: 37.6 % — AB (ref 38.4–49.9)
HGB: 12.4 g/dL — ABNORMAL LOW (ref 13.0–17.1)
LYMPH#: 1.8 10*3/uL (ref 0.9–3.3)
LYMPH%: 61.9 % — ABNORMAL HIGH (ref 14.0–49.0)
MCH: 31.2 pg (ref 27.2–33.4)
MCHC: 32.9 g/dL (ref 32.0–36.0)
MCV: 94.9 fL (ref 79.3–98.0)
MONO#: 0.1 10*3/uL (ref 0.1–0.9)
MONO%: 2.9 % (ref 0.0–14.0)
NEUT%: 30.6 % — AB (ref 39.0–75.0)
NEUTROS ABS: 0.9 10*3/uL — AB (ref 1.5–6.5)
Platelets: 80 10*3/uL — ABNORMAL LOW (ref 140–400)
RBC: 3.96 10*6/uL — ABNORMAL LOW (ref 4.20–5.82)
RDW: 12.9 % (ref 11.0–14.6)
WBC: 2.9 10*3/uL — AB (ref 4.0–10.3)

## 2014-02-05 LAB — COMPREHENSIVE METABOLIC PANEL (CC13)
ALT: 11 U/L (ref 0–55)
ANION GAP: 11 meq/L (ref 3–11)
AST: 6 U/L (ref 5–34)
Albumin: 3.2 g/dL — ABNORMAL LOW (ref 3.5–5.0)
Alkaline Phosphatase: 104 U/L (ref 40–150)
BUN: 27.8 mg/dL — ABNORMAL HIGH (ref 7.0–26.0)
CALCIUM: 9.4 mg/dL (ref 8.4–10.4)
CHLORIDE: 89 meq/L — AB (ref 98–109)
CO2: 33 mEq/L — ABNORMAL HIGH (ref 22–29)
Creatinine: 1 mg/dL (ref 0.7–1.3)
Glucose: 200 mg/dl — ABNORMAL HIGH (ref 70–140)
Potassium: 4.4 mEq/L (ref 3.5–5.1)
SODIUM: 133 meq/L — AB (ref 136–145)
Total Bilirubin: 1.61 mg/dL — ABNORMAL HIGH (ref 0.20–1.20)
Total Protein: 6.7 g/dL (ref 6.4–8.3)

## 2014-02-05 LAB — MAGNESIUM (CC13): MAGNESIUM: 1.6 mg/dL (ref 1.5–2.5)

## 2014-02-05 MED ORDER — GADOBENATE DIMEGLUMINE 529 MG/ML IV SOLN
20.0000 mL | Freq: Once | INTRAVENOUS | Status: AC | PRN
  Administered 2014-02-05: 20 mL via INTRAVENOUS

## 2014-02-05 NOTE — Telephone Encounter (Signed)
, °

## 2014-02-05 NOTE — Progress Notes (Addendum)
No images are attached to the encounter. No scans are attached to the encounter. No scans are attached to the encounter. 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 Status post 1 cycle  DISEASE STAGE: Small cell carcinoma of left lung - extensive stage   Primary site: Lung (Left)   Staging method: AJCC 7th Edition   Clinical: Stage IV (T2b, N3, M1b) signed by Curt Bears, MD on 01/24/2014  5:08 PM   Summary: Stage IV (T2b, N3, M1b)  CHEMOTHERAPY INTENT: control  CURRENT # OF CHEMOTHERAPY CYCLES:1  CURRENT ANTIEMETICS: Dexamethasone, Aloxi, Emend, Compazine  CURRENT SMOKING STATUS: Currently occasionally smokes  ORAL CHEMOTHERAPY AND CONSENT: n/a  CURRENT BISPHOSPHONATES USE: none  PAIN MANAGEMENT: none  NARCOTICS INDUCED CONSTIPATION:   LIVING WILL AND CODE STATUS:    INTERVAL HISTORY: Alex Hart 58 y.o. male returns for a scheduled regular symptom management visit for followup of his recently diagnosed small cell lung cancer. He tolerated the first cycle of systemic chemotherapy relatively well. He experienced some nausea and chills with a MAXIMUM TEMPERATURE of 99.4 however all the symptoms resolved. He continues on oxygen via nasal cannula at 2 L per minute. He voices no specific complaints today. He denied fever, chills, chest pain. He continues to have baseline shortness of breath but denies cough or hemoptysis. Denies any constipation or  diarrhea. His had no night sweats or significant weight loss.   MEDICAL HISTORY: Past Medical History  Diagnosis Date  . Muscle spasm of back   . Blood in stool   . Anxiety   . Arthritis   . Chronic pain syndrome   . Hx of tonsillectomy   . Bronchitis, chronic   . Hypertension   . Hyperlipidemia   . GERD (gastroesophageal reflux disease)   . COPD (chronic obstructive pulmonary disease)   . Small cell carcinoma of left lung 01/24/2014    ALLERGIES:  is allergic to chantix; azor; codeine; crestor; lipitor; lovastatin; and pravachol.  MEDICATIONS:  Current Outpatient Prescriptions  Medication Sig Dispense Refill  . ALPRAZolam (XANAX) 1 MG tablet Take 1 tablet (1 mg total) by mouth 3 (three) times daily as needed for anxiety.  90 tablet  2  . budesonide-formoterol (SYMBICORT) 160-4.5 MCG/ACT inhaler Inhale 1 puff into the lungs 2 (two) times daily.      . buprenorphine-naloxone (SUBOXONE) 2-0.5 MG SUBL Place 1 tablet under the tongue daily.       . COMBIVENT RESPIMAT 20-100 MCG/ACT AERS respimat INHALE 1 PUFF INTO THE LUNGS EVERY 6 HOURS  1 Inhaler  11  . mometasone-formoterol (DULERA) 100-5 MCG/ACT AERO Inhale 2 puffs into the lungs 2 (two) times daily.      . pantoprazole (PROTONIX) 40 MG tablet Take 1 tablet (40 mg total) by mouth daily.  90 tablet  3  . Tiotropium Bromide Monohydrate (SPIRIVA RESPIMAT) 2.5 MCG/ACT AERS 2 puffs each am  1 Inhaler  11  . triamterene-hydrochlorothiazide (MAXZIDE-25) 37.5-25 MG per tablet Take 1 tablet by mouth daily.  90 tablet  3  . LORazepam (ATIVAN) 1 MG tablet Take 1 tablet (1 mg total) by mouth once.  1 tablet  0  . PARoxetine (PAXIL) 10 MG tablet Take 1 tablet (10 mg total) by mouth daily.  30 tablet  5  . prochlorperazine (COMPAZINE) 10 MG tablet Take 1 tablet (10 mg total) by mouth every 6 (six) hours as needed for nausea or vomiting.  60 tablet  0   No current facility-administered medications for this visit.    SURGICAL HISTORY:  Past  Surgical History  Procedure Laterality Date  . Tonsillectomy    . Inguinal hernia repair  1990  . Back surgery  1992  . Abdominal surgery to remove adhesions from colon      teenager  . Video bronchoscopy Bilateral 12/19/2013    Procedure: VIDEO BRONCHOSCOPY WITHOUT FLUORO;  Surgeon: Tanda Rockers, MD;  Location: [facility];  Service: Cardiopulmonary;  Laterality: Bilateral;    REVIEW OF SYSTEMS:  Constitutional: positive for chills and fevers Eyes: negative Ears, nose, mouth, throat, and face: negative Respiratory: positive for dyspnea on exertion Cardiovascular: positive for tachycardia Gastrointestinal: positive for nausea Genitourinary:negative Integument/breast: negative Hematologic/lymphatic: negative Musculoskeletal:negative Neurological: negative Behavioral/Psych: negative Endocrine: negative Allergic/Immunologic: negative   PHYSICAL EXAMINATION: General appearance: alert, cooperative, appears stated age and no distress Head: Normocephalic, without obvious abnormality, atraumatic Neck: no adenopathy, no carotid bruit, no JVD, supple, symmetrical, trachea midline and thyroid not enlarged, symmetric, no tenderness/mass/nodules Lymph nodes: Cervical, supraclavicular, and axillary nodes normal. Resp: clear to auscultation bilaterally Back: symmetric, no curvature. ROM normal. No CVA tenderness. Cardio: tachycardia GI: soft, non-tender; bowel sounds normal; no masses,  no organomegaly Extremities: extremities normal, atraumatic, no cyanosis or edema Neurologic: Alert and oriented X 3, normal strength and tone. Normal symmetric reflexes. Normal coordination and gait  ECOG PERFORMANCE STATUS: 1 - Symptomatic but completely ambulatory  Blood pressure 125/87, pulse 138, temperature 98.3 F (36.8 C), temperature source Oral, resp. rate 18, height 5\' 11"  (1.803 m), weight 195 lb 9.6 oz (88.724 kg).  LABORATORY DATA: Lab Results  Component Value Date   WBC 2.9* 02/05/2014    HGB 12.4* 02/05/2014   HCT 37.6* 02/05/2014   MCV 94.9 02/05/2014   PLT 80* 02/05/2014      Chemistry      Component Value Date/Time   NA 133* 02/05/2014 1337   NA 140 11/12/2013 2015   K 4.4 02/05/2014 1337   K 4.3 11/12/2013 2015   CL 97 11/12/2013 2015   CO2 33* 02/05/2014 1337   CO2 28 11/12/2013 2015   BUN 27.8* 02/05/2014 1337   BUN 18 11/12/2013 2015   CREATININE 1.0 02/05/2014 1337   CREATININE 0.86 11/12/2013 2015      Component Value Date/Time   CALCIUM 9.4 02/05/2014 1337   CALCIUM 8.9 11/12/2013 2015   ALKPHOS 104 02/05/2014 1337   ALKPHOS 76 11/12/2013 2015   AST 6 02/05/2014 1337   AST 13 11/12/2013 2015   ALT 11 02/05/2014 1337   ALT 10 11/12/2013 2015   BILITOT 1.61* 02/05/2014 1337   BILITOT 0.5 11/12/2013 2015       RADIOGRAPHIC STUDIES:  Mr Jeri Cos Wo Contrast  02/05/2014   CLINICAL DATA:  58 year old male with small cell carcinoma of the left lung. Staging. Subsequent encounter.  EXAM: MRI HEAD WITHOUT AND WITH CONTRAST  TECHNIQUE: Multiplanar, multiecho pulse sequences of the brain  and surrounding structures were obtained without and with intravenous contrast.  CONTRAST:  56mL MULTIHANCE GADOBENATE DIMEGLUMINE 529 MG/ML IV SOLN  COMPARISON:  None.  FINDINGS: Mild motion artifact on post-contrast imaging. No abnormal enhancement identified. No midline shift, mass effect, or evidence of intracranial mass lesion.  Cerebral volume is within normal limits for age. No restricted diffusion to suggest acute infarction. No midline shift, mass effect, evidence of mass lesion, ventriculomegaly, extra-axial collection or acute intracranial hemorrhage. Cervicomedullary junction and pituitary are within normal limits. Major intracranial vascular flow voids are preserved. Evidence of previous cervical spine ACDF. Grossly negative visualized spinal cord. Visualized bone marrow signal is normal.  Scattered and patchy cerebral white matter T2 and FLAIR hyperintensity, nonspecific and moderate for age. Possible  small chronic lacunar infarcts in the right caudate and lentiform nuclei. No cortical encephalomalacia. Brainstem and cerebellum within normal limits. Mild right mastoid fluid. Other internal auditory structures appear within normal limits. Left mastoid and paranasal sinuses are clear. Visualized orbit soft tissues are within normal limits. Visualized scalp soft tissues are within normal limits.  IMPRESSION: 1.  No acute or metastatic intracranial abnormality. 2. Moderate for age nonspecific white matter signal changes, most commonly due to small vessel disease.   Electronically Signed   By: Lars Pinks M.D.   On: 02/05/2014 15:19   Ct Biopsy  01/10/2014   CLINICAL DATA:  Left lung masses and mediastinal and hilar adenopathy. Previous endobronchial biopsy demonstrated cells worrisome for poorly differentiated malignancy. Additional tissue sampling was requested for more definitive diagnosis.  EXAM: CT GUIDED CORE BIOPSY OF LEFT LUNG MASS  ANESTHESIA/SEDATION: 2.0  Mg IV Versed; 100 mcg IV Fentanyl  Total Moderate Sedation Time: 15 minutes.  PROCEDURE: The procedure risks, benefits, and alternatives were explained to the patient. Questions regarding the procedure were encouraged and answered. The patient understands and consents to the procedure.  The left posterior chest wall was prepped with Betadine in a sterile fashion, and a sterile drape was applied covering the operative field. A sterile gown and sterile gloves were used for the procedure. Local anesthesia was provided with 1% Lidocaine.  CT was performed in a prone and slightly oblique position. Under CT guidance, a 17 gauge trocar needle was advanced from a left paraspinous approach to the level of a left upper lobe mass. After confirming needle tip position, a total of three 18 gauge core biopsy samples were obtained and submitted in formalin. The outer needle was then removed and additional CT imaging performed.  COMPLICATIONS: Minimal pulmonary  hemorrhage.  FINDINGS: Posterior left upper lobe lung mass measures approximately 3.4 x 4.0 cm. Solid tissue was obtained with core biopsy. Post biopsy imaging shows minimal adjacent parenchymal hemorrhage anterior to the mass. No pneumothorax was present.  IMPRESSION: CT-guided core biopsy performed of the left upper lobe lung mass.   Electronically Signed   By: Aletta Edouard M.D.   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 Currently being treated with systemic chemotherapy in the form of cisplatin at 60 mg router squared given on day 1 and etoposide 120 mg per meters per given on days 1, 2 and 3 with Neulasta support given on day 4. He is status post 1 cycle. Overall he tolerated his first cycle of  chemotherapy relatively well. Patient was discussed with and also seen by Dr. Julien Nordmann. He'll continue with weekly labs as scheduled and return in 2 weeks prior to start of cycle #2. Patient is encouraged to followup with his cardiologist regarding his tachycardia.     Wynetta Emery, Anaeli Cornwall E, PA-C All questions were answered. The patient knows to call the clinic with any problems, questions or concerns. We can certainly see the patient much sooner if necessary.  ADDENDUM:  Hematology/Oncology Attending:  I had a face to face encounter with the patient. I recommended his care plan. This is a very pleasant 58 years old white male recently diagnosed with extensive stage small cell lung cancer and currently undergoing systemic chemotherapy with cisplatin and etoposide status post 1 cycle. The patient tolerated the first week of his treatment fairly well with no significant adverse effects except for mild nausea and low-grade fever.  MRI of the  brain showed no evidence for metastatic disease to the brain. I recommended for the patient to continue his treatment as scheduled. He would come back for followup visit in 2 weeks with the start of cycle #2. He was advised to call immediately if he has any concerning symptoms in the interval.  Disclaimer: This note was dictated with voice recognition software. Similar sounding words can inadvertently be transcribed and may not be corrected upon review. Eilleen Kempf., MD 02/09/2014

## 2014-02-05 NOTE — Progress Notes (Signed)
Put [employer] disability form on nurse's desk.

## 2014-02-05 NOTE — Progress Notes (Signed)
Put wife's fmla form on nurse's desk.

## 2014-02-07 ENCOUNTER — Encounter: Payer: Self-pay | Admitting: Internal Medicine

## 2014-02-07 NOTE — Progress Notes (Signed)
Put wife's fmla form in registration desk

## 2014-02-07 NOTE — Progress Notes (Signed)
Faxed disability form to [facility] @ [phone]

## 2014-02-08 NOTE — Patient Instructions (Signed)
Continue weekly labs as scheduled Followup in 2 weeks prior to next scheduled cycle of her chemotherapy.

## 2014-02-12 ENCOUNTER — Other Ambulatory Visit (HOSPITAL_BASED_OUTPATIENT_CLINIC_OR_DEPARTMENT_OTHER): Payer: BC Managed Care – PPO

## 2014-02-12 ENCOUNTER — Telehealth: Payer: Self-pay | Admitting: Medical Oncology

## 2014-02-12 DIAGNOSIS — C3492 Malignant neoplasm of unspecified part of left bronchus or lung: Secondary | ICD-10-CM

## 2014-02-12 DIAGNOSIS — C7A1 Malignant poorly differentiated neuroendocrine tumors: Secondary | ICD-10-CM

## 2014-02-12 LAB — CBC WITH DIFFERENTIAL/PLATELET
BASO%: 0.4 % (ref 0.0–2.0)
Basophils Absolute: 0.1 10*3/uL (ref 0.0–0.1)
EOS%: 0.8 % (ref 0.0–7.0)
Eosinophils Absolute: 0.1 10*3/uL (ref 0.0–0.5)
HCT: 35.4 % — ABNORMAL LOW (ref 38.4–49.9)
HGB: 11.9 g/dL — ABNORMAL LOW (ref 13.0–17.1)
LYMPH%: 23.2 % (ref 14.0–49.0)
MCH: 31.3 pg (ref 27.2–33.4)
MCHC: 33.5 g/dL (ref 32.0–36.0)
MCV: 93.6 fL (ref 79.3–98.0)
MONO#: 1.9 10*3/uL — ABNORMAL HIGH (ref 0.1–0.9)
MONO%: 11.6 % (ref 0.0–14.0)
NEUT#: 10.7 10*3/uL — ABNORMAL HIGH (ref 1.5–6.5)
NEUT%: 64 % (ref 39.0–75.0)
Platelets: 169 10*3/uL (ref 140–400)
RBC: 3.78 10*6/uL — AB (ref 4.20–5.82)
RDW: 12.6 % (ref 11.0–14.6)
WBC: 16.7 10*3/uL — ABNORMAL HIGH (ref 4.0–10.3)
lymph#: 3.9 10*3/uL — ABNORMAL HIGH (ref 0.9–3.3)

## 2014-02-12 LAB — COMPREHENSIVE METABOLIC PANEL (CC13)
ALBUMIN: 3.2 g/dL — AB (ref 3.5–5.0)
ALK PHOS: 127 U/L (ref 40–150)
ALT: 14 U/L (ref 0–55)
AST: 11 U/L (ref 5–34)
Anion Gap: 12 mEq/L — ABNORMAL HIGH (ref 3–11)
BUN: 16.9 mg/dL (ref 7.0–26.0)
CO2: 31 mEq/L — ABNORMAL HIGH (ref 22–29)
Calcium: 9.4 mg/dL (ref 8.4–10.4)
Chloride: 96 mEq/L — ABNORMAL LOW (ref 98–109)
Creatinine: 0.9 mg/dL (ref 0.7–1.3)
Glucose: 184 mg/dl — ABNORMAL HIGH (ref 70–140)
POTASSIUM: 4.1 meq/L (ref 3.5–5.1)
SODIUM: 138 meq/L (ref 136–145)
TOTAL PROTEIN: 6.9 g/dL (ref 6.4–8.3)
Total Bilirubin: <0.2 mg/dL (ref 0.20–1.20)

## 2014-02-12 LAB — MAGNESIUM (CC13): Magnesium: 1.3 mg/dl — CL (ref 1.5–2.5)

## 2014-02-12 MED ORDER — MAGNESIUM OXIDE 400 (241.3 MG) MG PO TABS: 400.0000 mg | ORAL_TABLET | Freq: Three times a day (TID) | ORAL | Status: DC

## 2014-02-12 NOTE — Telephone Encounter (Signed)
Left message with family member to pick up mag rx .

## 2014-02-12 NOTE — Telephone Encounter (Signed)
Message copied by Ardeen Garland on Tue Feb 12, 2014  4:31 PM ------      Message from: Curt Bears      Created: Tue Feb 12, 2014  2:37 PM       Call patient with the result and order Mg Oxide 400 mg tid #90 ------

## 2014-02-12 NOTE — Progress Notes (Signed)
Quick Note:  Call patient with the result and order Mg Oxide 400 mg tid #90 ______

## 2014-02-18 ENCOUNTER — Ambulatory Visit (INDEPENDENT_AMBULATORY_CARE_PROVIDER_SITE_OTHER): Payer: BC Managed Care – PPO | Admitting: Internal Medicine

## 2014-02-18 ENCOUNTER — Encounter: Payer: Self-pay | Admitting: Physician Assistant

## 2014-02-18 ENCOUNTER — Other Ambulatory Visit (HOSPITAL_BASED_OUTPATIENT_CLINIC_OR_DEPARTMENT_OTHER): Payer: BC Managed Care – PPO

## 2014-02-18 ENCOUNTER — Telehealth: Payer: Self-pay | Admitting: *Deleted

## 2014-02-18 ENCOUNTER — Ambulatory Visit (HOSPITAL_BASED_OUTPATIENT_CLINIC_OR_DEPARTMENT_OTHER): Payer: BC Managed Care – PPO | Admitting: Physician Assistant

## 2014-02-18 ENCOUNTER — Telehealth: Payer: Self-pay | Admitting: Internal Medicine

## 2014-02-18 ENCOUNTER — Encounter: Payer: Self-pay | Admitting: Internal Medicine

## 2014-02-18 VITALS — BP 120/84 | HR 131 | Temp 98.3°F | Resp 18 | Ht 70.0 in | Wt 197.7 lb

## 2014-02-18 VITALS — BP 110/62 | HR 132 | Temp 98.9°F | Ht 70.0 in | Wt 199.0 lb

## 2014-02-18 DIAGNOSIS — C3492 Malignant neoplasm of unspecified part of left bronchus or lung: Secondary | ICD-10-CM

## 2014-02-18 DIAGNOSIS — C7A1 Malignant poorly differentiated neuroendocrine tumors: Secondary | ICD-10-CM

## 2014-02-18 DIAGNOSIS — R222 Localized swelling, mass and lump, trunk: Secondary | ICD-10-CM

## 2014-02-18 DIAGNOSIS — J9611 Chronic respiratory failure with hypoxia: Secondary | ICD-10-CM

## 2014-02-18 DIAGNOSIS — F17200 Nicotine dependence, unspecified, uncomplicated: Secondary | ICD-10-CM

## 2014-02-18 DIAGNOSIS — Z23 Encounter for immunization: Secondary | ICD-10-CM

## 2014-02-18 DIAGNOSIS — Z9981 Dependence on supplemental oxygen: Secondary | ICD-10-CM

## 2014-02-18 DIAGNOSIS — J449 Chronic obstructive pulmonary disease, unspecified: Secondary | ICD-10-CM

## 2014-02-18 DIAGNOSIS — R0902 Hypoxemia: Secondary | ICD-10-CM

## 2014-02-18 DIAGNOSIS — J9610 Chronic respiratory failure, unspecified whether with hypoxia or hypercapnia: Secondary | ICD-10-CM

## 2014-02-18 DIAGNOSIS — R918 Other nonspecific abnormal finding of lung field: Secondary | ICD-10-CM

## 2014-02-18 DIAGNOSIS — R000 Tachycardia, unspecified: Secondary | ICD-10-CM

## 2014-02-18 LAB — CBC WITH DIFFERENTIAL/PLATELET
BASO%: 0.7 % (ref 0.0–2.0)
Basophils Absolute: 0.1 10*3/uL (ref 0.0–0.1)
EOS%: 0.2 % (ref 0.0–7.0)
Eosinophils Absolute: 0 10*3/uL (ref 0.0–0.5)
HEMATOCRIT: 31.5 % — AB (ref 38.4–49.9)
HGB: 10.6 g/dL — ABNORMAL LOW (ref 13.0–17.1)
LYMPH%: 14.6 % (ref 14.0–49.0)
MCH: 31.6 pg (ref 27.2–33.4)
MCHC: 33.8 g/dL (ref 32.0–36.0)
MCV: 93.5 fL (ref 79.3–98.0)
MONO#: 2.4 10*3/uL — AB (ref 0.1–0.9)
MONO%: 11.5 % (ref 0.0–14.0)
NEUT#: 15.1 10*3/uL — ABNORMAL HIGH (ref 1.5–6.5)
NEUT%: 73 % (ref 39.0–75.0)
PLATELETS: 490 10*3/uL — AB (ref 140–400)
RBC: 3.36 10*6/uL — ABNORMAL LOW (ref 4.20–5.82)
RDW: 12.7 % (ref 11.0–14.6)
WBC: 20.6 10*3/uL — AB (ref 4.0–10.3)
lymph#: 3 10*3/uL (ref 0.9–3.3)

## 2014-02-18 LAB — COMPREHENSIVE METABOLIC PANEL (CC13)
ALT: 10 U/L (ref 0–55)
ANION GAP: 12 meq/L — AB (ref 3–11)
AST: 8 U/L (ref 5–34)
Albumin: 3 g/dL — ABNORMAL LOW (ref 3.5–5.0)
Alkaline Phosphatase: 109 U/L (ref 40–150)
BILIRUBIN TOTAL: 0.22 mg/dL (ref 0.20–1.20)
BUN: 14.7 mg/dL (ref 7.0–26.0)
CO2: 33 meq/L — AB (ref 22–29)
Calcium: 9.6 mg/dL (ref 8.4–10.4)
Chloride: 92 mEq/L — ABNORMAL LOW (ref 98–109)
Creatinine: 0.9 mg/dL (ref 0.7–1.3)
GLUCOSE: 177 mg/dL — AB (ref 70–140)
Potassium: 4.2 mEq/L (ref 3.5–5.1)
Sodium: 137 mEq/L (ref 136–145)
Total Protein: 7 g/dL (ref 6.4–8.3)

## 2014-02-18 LAB — MAGNESIUM (CC13): MAGNESIUM: 1.8 mg/dL (ref 1.5–2.5)

## 2014-02-18 NOTE — Progress Notes (Signed)
Subjective:  Patient ID: Alex Hart, male    DOB: November 18, 1955   MRN: [id number]   Brief patient profile:  5 yowm quit smoking 11/2013 with doe x 2009 worse 2013 referred 10/30/2012 to pulmonary clinic by Alex Hart with GOLD III COPD documented 12/2012   History of Present Illness  10/30/2012 1st pulmonary eval still smoking  With doe x pushing a mower, rushing when walking dog, otherwise has just learned to pace himself. rec Plan A = automatic = symbicort 160 Take 2 puffs first thing in am and then another 2 puffs about 12 hours later.  Only use your albuterol as a rescue medication   12/22/2012 f/u ov/Alex Hart re GOLD III COPD/ still smoking Chief Complaint  Patient presents with  . Follow-up    Pt states breathing is unchanged since the last visit and denies any new co's.   If you stop smokiing now, your lung function should level off where it is - this is the most important aspect of your care Continue symbicort 160 Take 2 puffs first thing in am and then another 2 puffs about 12 hours later.  Only use your combivent as a rescue medication    06/27/2013 f/u ov/Alex Hart re: COPD GOLD III/ still smoking  Chief Complaint  Patient presents with  . Follow-up    Pt c/o SOB with any exertion, postnasal drainage, sometimes prod cough with yellow mucous X76m.  Pt states he is overusing rescue inhaler, believes it is is due to him panicking.  For months using more than twice daily combivent  Stop combivent While on prednisone double your fluid/blood pressure pill to offset fluid retention Change the protonix to 40 mg Take 30- 60 min before your first and last meals of the day until breathing/ coughing better then just one before bfast daily  Augmentin 875 mg take one pill twice daily  X 10 days - take at breakfast and supper with large glass of water.  It would help reduce the usual side effects (diarrhea and yeast infections) if you ate cultured yogurt at lunch.  Prednisone 10 mg take  4 each am  x 2 days,   2 each am x 2 days,  1 each am x 2 days and stop Plan A = automatic= symbicort 160 Take 2 puffs first thing in am and then another 2 puffs about 12 hours later.  And tudorza one twice daily Plan B= Backup= proair (albuterol) ok up to 2 puffs every 4 hours but if you need it more than twice daily you need to return here  Plan C = nebulizer (hold off for now and work on maintaining off cigarettes) Plan D = Doctor, call if needed Plan E = ER, if all else fails Monitor your blood pressure carefully at home and call Alex Hart if over 140/85   11/15/2013 Post ER l follow up  Was seen in ER on 4/6/ for cough , congestion and fever-102 x 5 days. In ER cxr showed 4.4 cm masslike density in left hilum. Subsequent CT showed 4x 8.7 cm left nodal conglomeration and 2.6 cm lul nodule. W/ patchy tree in bud infitrate throughout lungs. Bulky medistinal lymphadenopathy.  He was started on Levaquin  And prednisone  He is feeling some better but still weak  rec Finish Levaquin and Prednisone as directed.  Mucinex DM Twice daily  As needed  Cough/congestion  Fluids and rest    11/22/2013 post ER f/u ov/Alex Hart re: GOLD III copd/ sp ER eval  for fever rx levaquin x 10 d, pred pak Chief Complaint  Patient presents with  . Follow-up    Pt reports his breathing has improved, but not back to baseline. His cough is unchanged. No new co's today. He is using rescue inhaler at least once per day.  fever resolved, no hemoptysis / quit smoking since ER eval  rec If condition worsens >  Augmentin 875 mg take one pill twice daily  X 10 days - take at breakfast and supper with large glass of water.  It would help reduce the usual side effects (diarrhea and yeast infections) if you ate cultured yogurt at lunch.  No work until seen in follow up (last worked 11/11/13)  For cough mucinex dm 1200 mg every 12 hours as needed  Please schedule a follow up office visit in 2  weeks, sooner if needed with cxr    12/07/2013  f/u ov/Alex Hart re: pna vs ca LUL/qualifying for 24 h 02 / no longer smoking / never took augmentin Chief Complaint  Patient presents with  . Follow-up    Pt states that his SOB is about the same. Cough is still prod, but started to produce yellow to green sputum about 1 wk ago. Using rescue inhaler approx 4 times per day.   rec Go ahead and take the augmentin  Call me in one week to discuss possibility of bronchoscopy Please see patient coordinator before you leave today  to schedule 24 h 02    FOB 12/19/13 >  L true vocal cord paralysis, endobronchial dz LUL > crush aritifact suggestive/ not dx of small cell - PET 9/60/45 > Hypermetabolic masses in the central left upper lobe and medial left lung apex, suspicious for synchronous primary bronchogenic Carcinomas > CT guided bx rec > small cell  01/07/2014 f/u ov/[name] re: copd/ lung mass  Chief Complaint  Patient presents with  . Follow-up    Pt states his breathing is unchanged since last visit. Voice is becoming more hoarse and he is having some difficulty with swallowing.   No hemoptysis/ no purulent sputum.  Very sedentary but no sob on 02  rec Add spiriva         02/18/2014 f/u ov/[name] re:  02 3lpm 24/7  dulera 100/ spiriva and comibent rarely needed  Chief Complaint  Patient presents with  . Follow-up    Pt states that his breathing is unchanged since his last visit. No new co's today.   Now on 02 2lpm can walk slow up an aisle at [employer] but that's about it.   No obvious day to day or daytime variabilty  Sign sputum production/ Cp  chest tightness, subjective wheeze overt sinus or hb symptoms. No unusual exp hx or h/o childhood pna/ asthma or knowledge of premature birth.  Sleeping ok without nocturnal  or early am exacerbation  of respiratory  c/o's or need for noct saba. Also denies any obvious fluctuation of symptoms with weather or environmental changes or other aggravating or alleviating factors except as outlined  above   Current Medications, Allergies, Complete Past Medical History, Past Surgical History, Family History, and Social History were reviewed in [employer] record.  ROS  The following are not active complaints unless bolded sore throat, dysphagia, dental problems, itching, sneezing,  nasal congestion or excess/ purulent secretions, ear ache,   fever, chills, sweats, unintended wt loss, pleuritic or exertional cp, hemoptysis,  orthopnea pnd or leg swelling, presyncope, palpitations, heartburn, abdominal pain,  anorexia, nausea, vomiting, diarrhea  or change in bowel or urinary habits, change in stools or urine, dysuria,hematuria,  rash, arthralgias, visual complaints, headache, numbness weakness or ataxia or problems with walking or coordination,  change in mood/affect or memory.                 Objective:   Physical Exam  amb wm nad     06/27/2013     230  > 08/27/2013  216  >210 11/15/2013 > 11/22/2013  206 > 12/08/2013 206 > 01/09/2014  205 >  02/18/14 199   HEENT mild turbinate edema.  Oropharynx no thrush or excess pnd or cobblestoning.  No JVD or cervical adenopathy. Mild accessory muscle hypertrophy. Trachea midline, nl thryroid. Chest was hyperinflated by percussion with a few exp rhonchi esp LUL. Regular rate and rhythm without murmur gallop or rub or increase P2 or edema.  Abd: no hsm, nl excursion. Ext warm without cyanosis - POS mild clubbing        CXR  12/07/13 Stable left upper lobe mass as well as mediastinal adenopathy  compared to prior exam. These findings are concerning for  malignancy.     Assessment & Plan:

## 2014-02-18 NOTE — Patient Instructions (Addendum)
No change in pulmonary medications or 02  Pneumovax today   Please schedule a follow up visit in 3 months but call sooner if needed

## 2014-02-18 NOTE — Telephone Encounter (Signed)
Confirmed apt labs/ov w/pt per 07/13 POF, gave pt AVS and Bareatric liquid for pt for CT sent chemo schedule for 08/04 to [facility].Marland Kitchen..KJ

## 2014-02-18 NOTE — Progress Notes (Addendum)
No images are attached to the encounter. No scans are attached to the encounter. No scans are attached to the encounter. 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 Status post 1 cycle  DISEASE STAGE: Small cell carcinoma of left lung - extensive stage   Primary site: Lung (Left)   Staging method: AJCC 7th Edition   Clinical: Stage IV (T2b, N3, M1b) signed by Curt Bears, MD on 01/24/2014  5:08 PM   Summary: Stage IV (T2b, N3, M1b)  CHEMOTHERAPY INTENT: control  CURRENT # OF CHEMOTHERAPY CYCLES: 2  CURRENT ANTIEMETICS: Dexamethasone, Aloxi, Emend, Compazine  CURRENT SMOKING STATUS: Currently occasionally smokes  ORAL CHEMOTHERAPY AND CONSENT: n/a  CURRENT BISPHOSPHONATES USE: none  PAIN MANAGEMENT: none  NARCOTICS INDUCED CONSTIPATION:   LIVING WILL AND CODE STATUS:    INTERVAL HISTORY: Alex Hart 58 y.o. male returns for a scheduled regular symptom management visit for followup of his recently diagnosed small cell lung cancer. He tolerated the first cycle of systemic chemotherapy relatively well. He experienced some nausea and chills with a MAXIMUM TEMPERATURE of 99.4 however all the symptoms resolved. He reports that his hair has started to come out. He reports his breathing has improved and he decreased the use of his rescue inhaler.He continues to have some shortness of breath with exertion.He continues on oxygen via nasal cannula at 2 L per  minute. He voices no other specific complaints today. He denied fever, chills, chest pain. He continues to have baseline shortness of breath but denies cough or hemoptysis. Denies any constipation or diarrhea. His had no night sweats or significant weight loss.   MEDICAL HISTORY: Past Medical History  Diagnosis Date  . Muscle spasm of back   . Blood in stool   . Anxiety   . Arthritis   . Chronic pain syndrome   . Hx of tonsillectomy   . Bronchitis, chronic   . Hypertension   . Hyperlipidemia   . GERD (gastroesophageal reflux disease)   . COPD (chronic obstructive pulmonary disease)   . Small cell carcinoma of left lung 01/24/2014    ALLERGIES:  is allergic to chantix; azor; codeine; crestor; lipitor; lovastatin; and pravachol.  MEDICATIONS:  Current Outpatient Prescriptions  Medication Sig Dispense Refill  . ALPRAZolam (XANAX) 1 MG tablet Take 1 tablet (1 mg total) by mouth 3 (three) times daily as needed for anxiety.  90 tablet  2  . buprenorphine-naloxone (SUBOXONE) 2-0.5 MG SUBL Place 1 tablet under the tongue daily.       . COMBIVENT RESPIMAT 20-100 MCG/ACT AERS respimat INHALE 1 PUFF INTO THE LUNGS EVERY 6 HOURS  1 Inhaler  11  . magnesium oxide (MAG-OX) 400 (241.3 MG) MG tablet Take 1 tablet (400 mg total) by mouth 3 (three) times daily.  90 tablet  0  . mometasone-formoterol (DULERA) 100-5 MCG/ACT AERO Inhale 2 puffs into the lungs 2 (two) times daily.      Marland Kitchen  pantoprazole (PROTONIX) 40 MG tablet Take 1 tablet (40 mg total) by mouth daily.  90 tablet  3  . prochlorperazine (COMPAZINE) 10 MG tablet Take 1 tablet (10 mg total) by mouth every 6 (six) hours as needed for nausea or vomiting.  60 tablet  0  . Tiotropium Bromide Monohydrate (SPIRIVA RESPIMAT) 2.5 MCG/ACT AERS 2 puffs each am  1 Inhaler  11  . triamterene-hydrochlorothiazide (MAXZIDE-25) 37.5-25 MG per tablet Take 1 tablet by mouth daily.  90 tablet  3   No current facility-administered medications for this visit.     SURGICAL HISTORY:  Past Surgical History  Procedure Laterality Date  . Tonsillectomy    . Inguinal hernia repair  1990  . Back surgery  1992  . Abdominal surgery to remove adhesions from colon      teenager  . Video bronchoscopy Bilateral 12/19/2013    Procedure: VIDEO BRONCHOSCOPY WITHOUT FLUORO;  Surgeon: Tanda Rockers, MD;  Location: [facility] END[facility];  Service: Cardiopulmonary;  Laterality: Bilateral;    REVIEW OF SYSTEMS:  Constitutional: positive for chills and fevers Eyes: negative Ears, nose, mouth, throat, and face: negative Respiratory: positive for dyspnea on exertion Cardiovascular: positive for tachycardia Gastrointestinal: positive for nausea Genitourinary:negative Integument/breast: negative Hematologic/lymphatic: negative Musculoskeletal:negative Neurological: negative Behavioral/Psych: negative Endocrine: negative Allergic/Immunologic: negative   PHYSICAL EXAMINATION: General appearance: alert, cooperative, appears stated age and no distress Head: Normocephalic, without obvious abnormality, atraumatic Neck: no adenopathy, no carotid bruit, no JVD, supple, symmetrical, trachea midline and thyroid not enlarged, symmetric, no tenderness/mass/nodules Lymph nodes: Cervical, supraclavicular, and axillary nodes normal. Resp: clear to auscultation bilaterally Back: symmetric, no curvature. ROM normal. No CVA tenderness. Cardio: tachycardia GI: soft, non-tender; bowel sounds normal; no masses,  no organomegaly Extremities: extremities normal, atraumatic, no cyanosis or edema Neurologic: Alert and oriented X 3, normal strength and tone. Normal symmetric reflexes. Normal coordination and gait  ECOG PERFORMANCE STATUS: 1 - Symptomatic but completely ambulatory  Blood pressure 120/84, pulse 131, temperature 98.3 F (36.8 C), temperature source Oral, resp. rate 18, height 5\' 10"  (1.778 m), weight 197 lb 11.2 oz (89.676 kg), SpO2 95.00%.  LABORATORY DATA: Lab Results   Component Value Date   WBC 20.6* 02/18/2014   HGB 10.6* 02/18/2014   HCT 31.5* 02/18/2014   MCV 93.5 02/18/2014   PLT 490* 02/18/2014      Chemistry      Component Value Date/Time   NA 137 02/18/2014 1035   NA 140 11/12/2013 2015   K 4.2 02/18/2014 1035   K 4.3 11/12/2013 2015   CL 97 11/12/2013 2015   CO2 33* 02/18/2014 1035   CO2 28 11/12/2013 2015   BUN 14.7 02/18/2014 1035   BUN 18 11/12/2013 2015   CREATININE 0.9 02/18/2014 1035   CREATININE 0.86 11/12/2013 2015      Component Value Date/Time   CALCIUM 9.6 02/18/2014 1035   CALCIUM 8.9 11/12/2013 2015   ALKPHOS 109 02/18/2014 1035   ALKPHOS 76 11/12/2013 2015   AST 8 02/18/2014 1035   AST 13 11/12/2013 2015   ALT 10 02/18/2014 1035   ALT 10 11/12/2013 2015   BILITOT 0.22 02/18/2014 1035   BILITOT 0.5 11/12/2013 2015       RADIOGRAPHIC STUDIES:  Mr Kizzie Fantasia Contrast  02/05/2014   CLINICAL DATA:  58 year old male with small cell carcinoma of the left lung. Staging. Subsequent encounter.  EXAM: MRI HEAD WITHOUT AND WITH CONTRAST  TECHNIQUE: Multiplanar, multiecho pulse sequences of the brain and surrounding structures  were obtained without and with intravenous contrast.  CONTRAST:  58mL MULTIHANCE GADOBENATE DIMEGLUMINE 529 MG/ML IV SOLN  COMPARISON:  None.  FINDINGS: Mild motion artifact on post-contrast imaging. No abnormal enhancement identified. No midline shift, mass effect, or evidence of intracranial mass lesion.  Cerebral volume is within normal limits for age. No restricted diffusion to suggest acute infarction. No midline shift, mass effect, evidence of mass lesion, ventriculomegaly, extra-axial collection or acute intracranial hemorrhage. Cervicomedullary junction and pituitary are within normal limits. Major intracranial vascular flow voids are preserved. Evidence of previous cervical spine ACDF. Grossly negative visualized spinal cord. Visualized bone marrow signal is normal.  Scattered and patchy cerebral white matter T2 and FLAIR  hyperintensity, nonspecific and moderate for age. Possible small chronic lacunar infarcts in the right caudate and lentiform nuclei. No cortical encephalomalacia. Brainstem and cerebellum within normal limits. Mild right mastoid fluid. Other internal auditory structures appear within normal limits. Left mastoid and paranasal sinuses are clear. Visualized orbit soft tissues are within normal limits. Visualized scalp soft tissues are within normal limits.  IMPRESSION: 1.  No acute or metastatic intracranial abnormality. 2. Moderate for age nonspecific white matter signal changes, most commonly due to small vessel disease.   Electronically Signed   By: Lars Pinks M.D.   On: 02/05/2014 15:19   Ct Biopsy  01/10/2014   CLINICAL DATA:  Left lung masses and mediastinal and hilar adenopathy. Previous endobronchial biopsy demonstrated cells worrisome for poorly differentiated malignancy. Additional tissue sampling was requested for more definitive diagnosis.  EXAM: CT GUIDED CORE BIOPSY OF LEFT LUNG MASS  ANESTHESIA/SEDATION: 2.0  Mg IV Versed; 100 mcg IV Fentanyl  Total Moderate Sedation Time: 15 minutes.  PROCEDURE: The procedure risks, benefits, and alternatives were explained to the patient. Questions regarding the procedure were encouraged and answered. The patient understands and consents to the procedure.  The left posterior chest wall was prepped with Betadine in a sterile fashion, and a sterile drape was applied covering the operative field. A sterile gown and sterile gloves were used for the procedure. Local anesthesia was provided with 1% Lidocaine.  CT was performed in a prone and slightly oblique position. Under CT guidance, a 17 gauge trocar needle was advanced from a left paraspinous approach to the level of a left upper lobe mass. After confirming needle tip position, a total of three 18 gauge core biopsy samples were obtained and submitted in formalin. The outer needle was then removed and additional CT  imaging performed.  COMPLICATIONS: Minimal pulmonary hemorrhage.  FINDINGS: Posterior left upper lobe lung mass measures approximately 3.4 x 4.0 cm. Solid tissue was obtained with core biopsy. Post biopsy imaging shows minimal adjacent parenchymal hemorrhage anterior to the mass. No pneumothorax was present.  IMPRESSION: CT-guided core biopsy performed of the left upper lobe lung mass.   Electronically Signed   By: Aletta Edouard M.D.   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 Currently being treated with systemic chemotherapy in the form of cisplatin at 60 mg router squared given on day 1 and etoposide 120 mg per meters per given on days 1, 2 and 3 with Neulasta support given on day 4. He is status post 1 cycle. Overall he tolerated his first cycle of chemotherapy relatively well.  Patient was discussed with and also seen by Dr. Julien Nordmann. Lab results were reviewed and are within treatable range. He will proceed with cycle #2 of his systemic chemotherapy with cisplatin and etoposide with Neulasta support. He'll continue with weekly labs as scheduled and return in 3 weeks prior to start of cycle #3 with a restaging CT scan of his chest, abdomen and pelvis with contrast to re-evaluate his disease. Patient is encouraged to followup with his cardiologist regarding his tachycardia.     Wynetta Emery, Rosabel Sermeno E, PA-C All questions were answered. The patient knows to call the clinic with any problems, questions or concerns. We can certainly see the patient much sooner if necessary.  ADDENDUM: Hematology/Oncology Attending: I had a face to face encounter with the patient. I recommended his care plan. This is a very pleasant 58 years old white male  recently diagnosed with extensive stage small cell lung cancer and currently undergoing systemic chemotherapy with cisplatin and etoposide status post 1 cycle. He tolerated the first cycle of his treatment fairly well except for fatigue and mild nausea. He is here today to start cycle #2 of his treatment. I recommended for the patient to proceed with treatment as scheduled. He would come back for followup visit in 3 weeks for reevaluation with repeat CT scan of the chest, abdomen and pelvis for restaging of his disease before starting cycle #3. He was advised to call me immediately if he has any concerning symptoms in the interval.  Disclaimer: This note was dictated with voice recognition software. Similar sounding words can inadvertently be transcribed and may not be corrected upon review. Eilleen Kempf., MD 02/23/2014

## 2014-02-18 NOTE — Telephone Encounter (Signed)
Per staff message and POF I have scheduled appts. Advised scheduler of appts. JMW  

## 2014-02-19 ENCOUNTER — Encounter: Payer: Self-pay | Admitting: Physician Assistant

## 2014-02-19 ENCOUNTER — Other Ambulatory Visit: Payer: BC Managed Care – PPO

## 2014-02-19 ENCOUNTER — Encounter: Payer: Self-pay | Admitting: Internal Medicine

## 2014-02-19 ENCOUNTER — Ambulatory Visit (HOSPITAL_BASED_OUTPATIENT_CLINIC_OR_DEPARTMENT_OTHER): Payer: BC Managed Care – PPO

## 2014-02-19 ENCOUNTER — Other Ambulatory Visit: Payer: Self-pay | Admitting: Internal Medicine

## 2014-02-19 ENCOUNTER — Other Ambulatory Visit: Payer: Self-pay | Admitting: *Deleted

## 2014-02-19 VITALS — BP 117/74 | HR 116 | Temp 98.8°F | Resp 18

## 2014-02-19 DIAGNOSIS — C3492 Malignant neoplasm of unspecified part of left bronchus or lung: Secondary | ICD-10-CM

## 2014-02-19 DIAGNOSIS — Z5111 Encounter for antineoplastic chemotherapy: Secondary | ICD-10-CM

## 2014-02-19 DIAGNOSIS — C7A1 Malignant poorly differentiated neuroendocrine tumors: Secondary | ICD-10-CM

## 2014-02-19 MED ORDER — DEXAMETHASONE SODIUM PHOSPHATE 20 MG/5ML IJ SOLN
INTRAMUSCULAR | Status: AC
  Filled 2014-02-19: qty 5

## 2014-02-19 MED ORDER — PALONOSETRON HCL INJECTION 0.25 MG/5ML
0.2500 mg | Freq: Once | INTRAVENOUS | Status: AC
  Administered 2014-02-19: 0.25 mg via INTRAVENOUS

## 2014-02-19 MED ORDER — SODIUM CHLORIDE 0.9 % IV SOLN
60.0000 mg/m2 | Freq: Once | INTRAVENOUS | Status: AC
  Administered 2014-02-19: 127 mg via INTRAVENOUS
  Filled 2014-02-19: qty 127

## 2014-02-19 MED ORDER — DEXAMETHASONE SODIUM PHOSPHATE 20 MG/5ML IJ SOLN
12.0000 mg | Freq: Once | INTRAMUSCULAR | Status: AC
  Administered 2014-02-19: 12 mg via INTRAVENOUS

## 2014-02-19 MED ORDER — SODIUM CHLORIDE 0.9 % IV SOLN
150.0000 mg | Freq: Once | INTRAVENOUS | Status: AC
  Administered 2014-02-19: 150 mg via INTRAVENOUS
  Filled 2014-02-19: qty 5

## 2014-02-19 MED ORDER — SODIUM CHLORIDE 0.9 % IV SOLN
Freq: Once | INTRAVENOUS | Status: AC
  Administered 2014-02-19: 09:00:00 via INTRAVENOUS

## 2014-02-19 MED ORDER — POTASSIUM CHLORIDE 2 MEQ/ML IV SOLN
Freq: Once | INTRAVENOUS | Status: AC
  Administered 2014-02-19: 09:00:00 via INTRAVENOUS
  Filled 2014-02-19: qty 10

## 2014-02-19 MED ORDER — SODIUM CHLORIDE 0.9 % IV SOLN
120.0000 mg/m2 | Freq: Once | INTRAVENOUS | Status: AC
  Administered 2014-02-19: 250 mg via INTRAVENOUS
  Filled 2014-02-19: qty 12.5

## 2014-02-19 MED ORDER — PALONOSETRON HCL INJECTION 0.25 MG/5ML
INTRAVENOUS | Status: AC
  Filled 2014-02-19: qty 5

## 2014-02-19 NOTE — Assessment & Plan Note (Signed)
-   See CT Chest 11/12/13 > treat as LUL post segment  pneumonia with levaquin x 10 d - 12/07/2013 repeat abx x 10 d augmentin then FOB 12/19/13 > paralyzed L True VC,  endobronchial dz LUL not obst > crush aritifact suggestive/ not dx of small cell - PET 12/27/72 > Hypermetabolic masses in the central left upper lobe and medial left lung apex, suspicious for synchronous primary bronchogenic carcinomas. Bulky left hilar and mediastinal lymph node metastases. Mild metastatic abdominal lymphadenopathy in the central small bowel Mesentery. - CT bx  LUL post mass 01/10/2014 >  Small Cell Lung ca > referred 01/15/2014 to oncology > rx started  01/29/14   rx per oncology

## 2014-02-19 NOTE — Patient Instructions (Signed)
[facility] Discharge Instructions for Patients Receiving Chemotherapy  Today you received the following chemotherapy agents Cisplatin and Etoposide.  To help prevent nausea and vomiting after your treatment, we encourage you to take your nausea medication as directed.    If you develop nausea and vomiting that is not controlled by your nausea medication, call the clinic.   BELOW ARE SYMPTOMS THAT SHOULD BE REPORTED IMMEDIATELY:  *FEVER GREATER THAN 100.5 F  *CHILLS WITH OR WITHOUT FEVER  NAUSEA AND VOMITING THAT IS NOT CONTROLLED WITH YOUR NAUSEA MEDICATION  *UNUSUAL SHORTNESS OF BREATH  *UNUSUAL BRUISING OR BLEEDING  TENDERNESS IN MOUTH AND THROAT WITH OR WITHOUT PRESENCE OF ULCERS  *URINARY PROBLEMS  *BOWEL PROBLEMS  UNUSUAL RASH Items with * indicate a potential emergency and should be followed up as soon as possible.  Feel free to call the clinic you have any questions or concerns. The clinic phone number is [phone].

## 2014-02-19 NOTE — Assessment & Plan Note (Signed)
02 % 86% RA 12/07/2013 > 24 h 02 2lpm  Adequate control on present rx, reviewed > no change in rx needed

## 2014-02-19 NOTE — Patient Instructions (Signed)
Continue weekly labs as scheduled Follow up in 3 weeks with restaging CT scan to re-evaluate your disease

## 2014-02-19 NOTE — Assessment & Plan Note (Signed)
I took an extended  opportunity with this patient to outline the consequences of continued cigarette use  in airway disorders based on all the data we have from the multiple national lung health studies (perfomed over decades at millions of dollars in cost)  indicating that smoking cessation, not choice of inhalers or physicians, is the most important aspect of care.   

## 2014-02-19 NOTE — Assessment & Plan Note (Signed)
-   HFA  90% 11/22/2013  - PFT's 12/07/12 FEV1  1.12 (33%) ratio 49 and 17% improved p B2, DLCO 65 improved to 92% p correction    Despite still active smoking >  Adequate control on present rx, reviewed > no change in rx needed

## 2014-02-20 ENCOUNTER — Ambulatory Visit (HOSPITAL_BASED_OUTPATIENT_CLINIC_OR_DEPARTMENT_OTHER): Payer: BC Managed Care – PPO

## 2014-02-20 VITALS — BP 154/71 | HR 101 | Temp 98.5°F | Resp 20

## 2014-02-20 DIAGNOSIS — C3492 Malignant neoplasm of unspecified part of left bronchus or lung: Secondary | ICD-10-CM

## 2014-02-20 DIAGNOSIS — Z5111 Encounter for antineoplastic chemotherapy: Secondary | ICD-10-CM

## 2014-02-20 DIAGNOSIS — C7A1 Malignant poorly differentiated neuroendocrine tumors: Secondary | ICD-10-CM

## 2014-02-20 MED ORDER — DEXAMETHASONE SODIUM PHOSPHATE 10 MG/ML IJ SOLN
INTRAMUSCULAR | Status: AC
  Filled 2014-02-20: qty 1

## 2014-02-20 MED ORDER — ETOPOSIDE CHEMO INJECTION 1 GM/50ML
120.0000 mg/m2 | Freq: Once | INTRAVENOUS | Status: AC
  Administered 2014-02-20: 250 mg via INTRAVENOUS
  Filled 2014-02-20: qty 12.5

## 2014-02-20 MED ORDER — DEXAMETHASONE SODIUM PHOSPHATE 10 MG/ML IJ SOLN
10.0000 mg | Freq: Once | INTRAMUSCULAR | Status: AC
  Administered 2014-02-20: 10 mg via INTRAVENOUS

## 2014-02-20 MED ORDER — SODIUM CHLORIDE 0.9 % IV SOLN
Freq: Once | INTRAVENOUS | Status: AC
  Administered 2014-02-20: 11:00:00 via INTRAVENOUS

## 2014-02-20 NOTE — Patient Instructions (Signed)
[facility] Discharge Instructions for Patients Receiving Chemotherapy  Today you received the following chemotherapy agents Etoposide.  To help prevent nausea and vomiting after your treatment, we encourage you to take your nausea medication.   If you develop nausea and vomiting that is not controlled by your nausea medication, call the clinic.   BELOW ARE SYMPTOMS THAT SHOULD BE REPORTED IMMEDIATELY:  *FEVER GREATER THAN 100.5 F  *CHILLS WITH OR WITHOUT FEVER  NAUSEA AND VOMITING THAT IS NOT CONTROLLED WITH YOUR NAUSEA MEDICATION  *UNUSUAL SHORTNESS OF BREATH  *UNUSUAL BRUISING OR BLEEDING  TENDERNESS IN MOUTH AND THROAT WITH OR WITHOUT PRESENCE OF ULCERS  *URINARY PROBLEMS  *BOWEL PROBLEMS  UNUSUAL RASH Items with * indicate a potential emergency and should be followed up as soon as possible.  Feel free to call the clinic you have any questions or concerns. The clinic phone number is [phone].

## 2014-02-21 ENCOUNTER — Ambulatory Visit (HOSPITAL_BASED_OUTPATIENT_CLINIC_OR_DEPARTMENT_OTHER): Payer: BC Managed Care – PPO

## 2014-02-21 VITALS — BP 152/83 | HR 110 | Temp 97.9°F | Resp 20

## 2014-02-21 DIAGNOSIS — Z5111 Encounter for antineoplastic chemotherapy: Secondary | ICD-10-CM

## 2014-02-21 DIAGNOSIS — C7A1 Malignant poorly differentiated neuroendocrine tumors: Secondary | ICD-10-CM

## 2014-02-21 DIAGNOSIS — C3492 Malignant neoplasm of unspecified part of left bronchus or lung: Secondary | ICD-10-CM

## 2014-02-21 MED ORDER — DEXAMETHASONE SODIUM PHOSPHATE 10 MG/ML IJ SOLN
INTRAMUSCULAR | Status: AC
  Filled 2014-02-21: qty 1

## 2014-02-21 MED ORDER — SODIUM CHLORIDE 0.9 % IV SOLN
Freq: Once | INTRAVENOUS | Status: AC
  Administered 2014-02-21: 12:00:00 via INTRAVENOUS

## 2014-02-21 MED ORDER — DEXAMETHASONE SODIUM PHOSPHATE 10 MG/ML IJ SOLN
10.0000 mg | Freq: Once | INTRAMUSCULAR | Status: AC
  Administered 2014-02-21: 10 mg via INTRAVENOUS

## 2014-02-21 MED ORDER — SODIUM CHLORIDE 0.9 % IV SOLN
120.0000 mg/m2 | Freq: Once | INTRAVENOUS | Status: AC
  Administered 2014-02-21: 250 mg via INTRAVENOUS
  Filled 2014-02-21: qty 12.5

## 2014-02-21 NOTE — Patient Instructions (Addendum)
[facility] Discharge Instructions for Patients Receiving Chemotherapy  Today you received the following chemotherapy agents: Etoposide.  To help prevent nausea and vomiting after your treatment, we encourage you to take your nausea medication, Compazine.   If you develop nausea and vomiting that is not controlled by your nausea medication, call the clinic.   BELOW ARE SYMPTOMS THAT SHOULD BE REPORTED IMMEDIATELY:  *FEVER GREATER THAN 100.5 F  *CHILLS WITH OR WITHOUT FEVER  NAUSEA AND VOMITING THAT IS NOT CONTROLLED WITH YOUR NAUSEA MEDICATION  *UNUSUAL SHORTNESS OF BREATH  *UNUSUAL BRUISING OR BLEEDING  TENDERNESS IN MOUTH AND THROAT WITH OR WITHOUT PRESENCE OF ULCERS  *URINARY PROBLEMS  *BOWEL PROBLEMS  UNUSUAL RASH Items with * indicate a potential emergency and should be followed up as soon as possible.  Feel free to call the clinic you have any questions or concerns. The clinic phone number is [phone].

## 2014-02-22 ENCOUNTER — Ambulatory Visit: Payer: BC Managed Care – PPO

## 2014-02-22 ENCOUNTER — Telehealth: Payer: Self-pay | Admitting: *Deleted

## 2014-02-22 NOTE — Telephone Encounter (Signed)
Patient's wife, Lattie Haw left VM stating patient is having constipation. States patient has tried several things and nothing has worked. I called patient's wife back and she reports patient drank a whole bottle of magnesium citrate last night and has been taking senna-S. Spoke to patient directly and he says he has taken senna-S two times daily for 3 days. He has passed small amount of gas today and also threw up once this morning. Spoke to [employer], [place] and she advised patient try OTC dulcolax tablets and if not effective by tonight to try another half a bottle of magnesium citrate. If no BM by tomorrow, patient needs to call on call MD. Eliezer Lofts back and passed along information. Wife agreeable to get this and voiced understanding.

## 2014-02-22 NOTE — Telephone Encounter (Signed)
Patient's wife Alex Hart called back stating [name] has thrown up 3 times since speaking to [place] this morning. He was feeling so sick that he was unable to come in for his neulasta injection. Spoke to [place] - she recommends patient go to ED and reschedule injection for tomorrow 02/23/14. Wife agreeable to this.

## 2014-02-23 ENCOUNTER — Ambulatory Visit (HOSPITAL_BASED_OUTPATIENT_CLINIC_OR_DEPARTMENT_OTHER): Payer: BC Managed Care – PPO

## 2014-02-23 VITALS — BP 100/70 | HR 133 | Temp 98.2°F | Resp 18

## 2014-02-23 DIAGNOSIS — C7A1 Malignant poorly differentiated neuroendocrine tumors: Secondary | ICD-10-CM

## 2014-02-23 DIAGNOSIS — Z5189 Encounter for other specified aftercare: Secondary | ICD-10-CM

## 2014-02-23 DIAGNOSIS — C3492 Malignant neoplasm of unspecified part of left bronchus or lung: Secondary | ICD-10-CM

## 2014-02-23 MED ORDER — PEGFILGRASTIM INJECTION 6 MG/0.6ML
6.0000 mg | Freq: Once | SUBCUTANEOUS | Status: AC
  Administered 2014-02-23: 6 mg via SUBCUTANEOUS

## 2014-02-23 NOTE — Patient Instructions (Signed)

## 2014-02-25 ENCOUNTER — Telehealth: Payer: Self-pay | Admitting: Internal Medicine

## 2014-02-25 NOTE — Telephone Encounter (Signed)
Spk w/pt confirming change in apt time, mailed updated schedule to pt.Marland Kitchen..KJ

## 2014-02-26 ENCOUNTER — Ambulatory Visit (HOSPITAL_BASED_OUTPATIENT_CLINIC_OR_DEPARTMENT_OTHER): Payer: BC Managed Care – PPO

## 2014-02-26 ENCOUNTER — Other Ambulatory Visit: Payer: Self-pay | Admitting: *Deleted

## 2014-02-26 ENCOUNTER — Telehealth: Payer: Self-pay | Admitting: *Deleted

## 2014-02-26 ENCOUNTER — Other Ambulatory Visit (HOSPITAL_BASED_OUTPATIENT_CLINIC_OR_DEPARTMENT_OTHER): Payer: BC Managed Care – PPO

## 2014-02-26 DIAGNOSIS — R112 Nausea with vomiting, unspecified: Secondary | ICD-10-CM

## 2014-02-26 DIAGNOSIS — C3492 Malignant neoplasm of unspecified part of left bronchus or lung: Secondary | ICD-10-CM

## 2014-02-26 DIAGNOSIS — C7A1 Malignant poorly differentiated neuroendocrine tumors: Secondary | ICD-10-CM

## 2014-02-26 LAB — COMPREHENSIVE METABOLIC PANEL (CC13)
ALBUMIN: 2.9 g/dL — AB (ref 3.5–5.0)
ALT: 9 U/L (ref 0–55)
AST: 9 U/L (ref 5–34)
Alkaline Phosphatase: 138 U/L (ref 40–150)
Anion Gap: 11 mEq/L (ref 3–11)
BUN: 51.6 mg/dL — AB (ref 7.0–26.0)
CO2: 36 mEq/L — ABNORMAL HIGH (ref 22–29)
Calcium: 9.1 mg/dL (ref 8.4–10.4)
Chloride: 89 mEq/L — ABNORMAL LOW (ref 98–109)
Creatinine: 1.4 mg/dL — ABNORMAL HIGH (ref 0.7–1.3)
GLUCOSE: 174 mg/dL — AB (ref 70–140)
POTASSIUM: 4.1 meq/L (ref 3.5–5.1)
Sodium: 135 mEq/L — ABNORMAL LOW (ref 136–145)
TOTAL PROTEIN: 6.2 g/dL — AB (ref 6.4–8.3)
Total Bilirubin: 1.07 mg/dL (ref 0.20–1.20)

## 2014-02-26 LAB — CBC WITH DIFFERENTIAL/PLATELET
BASO%: 0.4 % (ref 0.0–2.0)
BASOS ABS: 0.1 10*3/uL (ref 0.0–0.1)
EOS ABS: 0 10*3/uL (ref 0.0–0.5)
EOS%: 0.2 % (ref 0.0–7.0)
HCT: 30.5 % — ABNORMAL LOW (ref 38.4–49.9)
HGB: 9.9 g/dL — ABNORMAL LOW (ref 13.0–17.1)
LYMPH#: 1.7 10*3/uL (ref 0.9–3.3)
LYMPH%: 9.9 % — ABNORMAL LOW (ref 14.0–49.0)
MCH: 30.4 pg (ref 27.2–33.4)
MCHC: 32.4 g/dL (ref 32.0–36.0)
MCV: 93.7 fL (ref 79.3–98.0)
MONO#: 0.3 10*3/uL (ref 0.1–0.9)
MONO%: 1.9 % (ref 0.0–14.0)
NEUT%: 87.6 % — ABNORMAL HIGH (ref 39.0–75.0)
NEUTROS ABS: 15 10*3/uL — AB (ref 1.5–6.5)
Platelets: 328 10*3/uL (ref 140–400)
RBC: 3.25 10*6/uL — ABNORMAL LOW (ref 4.20–5.82)
RDW: 12.7 % (ref 11.0–14.6)
WBC: 17.1 10*3/uL — ABNORMAL HIGH (ref 4.0–10.3)

## 2014-02-26 LAB — MAGNESIUM (CC13): MAGNESIUM: 2.4 mg/dL (ref 1.5–2.5)

## 2014-02-26 MED ORDER — SODIUM CHLORIDE 0.9 % IV SOLN
Freq: Once | INTRAVENOUS | Status: DC
  Administered 2014-02-26: 12:00:00 via INTRAVENOUS

## 2014-02-26 NOTE — Patient Instructions (Signed)
Dehydration, Adult Dehydration is when you lose more fluids from the body than you take in. Vital organs like the kidneys, brain, and heart cannot function without a proper amount of fluids and salt. Any loss of fluids from the body can cause dehydration.  CAUSES   Vomiting.  Diarrhea.  Excessive sweating.  Excessive urine output.  Fever. SYMPTOMS  Mild dehydration  Thirst.  Dry lips.  Slightly dry mouth. Moderate dehydration  Very dry mouth.  Sunken eyes.  Skin does not bounce back quickly when lightly pinched and released.  Dark urine and decreased urine production.  Decreased tear production.  Headache. Severe dehydration  Very dry mouth.  Extreme thirst.  Rapid, weak pulse (more than 100 beats per minute at rest).  Cold hands and feet.  Not able to sweat in spite of heat and temperature.  Rapid breathing.  Blue lips.  Confusion and lethargy.  Difficulty being awakened.  Minimal urine production.  No tears. DIAGNOSIS  Your caregiver will diagnose dehydration based on your symptoms and your exam. Blood and urine tests will help confirm the diagnosis. The diagnostic evaluation should also identify the cause of dehydration. TREATMENT  Treatment of mild or moderate dehydration can often be done at home by increasing the amount of fluids that you drink. It is best to drink small amounts of fluid more often. Drinking too much at one time can make vomiting worse. Refer to the home care instructions below. Severe dehydration needs to be treated at the hospital where you will probably be given intravenous (IV) fluids that contain water and electrolytes. HOME CARE INSTRUCTIONS   Ask your caregiver about specific rehydration instructions.  Drink enough fluids to keep your urine clear or pale yellow.  Drink small amounts frequently if you have nausea and vomiting.  Eat as you normally do.  Avoid:  Foods or drinks high in sugar.  Carbonated  drinks.  Juice.  Extremely hot or cold fluids.  Drinks with caffeine.  Fatty, greasy foods.  Alcohol.  Tobacco.  Overeating.  Gelatin desserts.  Wash your hands well to avoid spreading bacteria and viruses.  Only take over-the-counter or prescription medicines for pain, discomfort, or fever as directed by your caregiver.  Ask your caregiver if you should continue all prescribed and over-the-counter medicines.  Keep all follow-up appointments with your caregiver. SEEK MEDICAL CARE IF:  You have abdominal pain and it increases or stays in one area (localizes).  You have a rash, stiff neck, or severe headache.  You are irritable, sleepy, or difficult to awaken.  You are weak, dizzy, or extremely thirsty. SEEK IMMEDIATE MEDICAL CARE IF:   You are unable to keep fluids down or you get worse despite treatment.  You have frequent episodes of vomiting or diarrhea.  You have blood or green matter (bile) in your vomit.  You have blood in your stool or your stool looks black and tarry.  You have not urinated in 6 to 8 hours, or you have only urinated a small amount of very dark urine.  You have a fever.  You faint. MAKE SURE YOU:   Understand these instructions.  Will watch your condition.  Will get help right away if you are not doing well or get worse. Document Released: 07/26/2005 Document Revised: 10/18/2011 Document Reviewed: 03/15/2011 ExitCare Patient Information 2015 [employer]. This information is not intended to replace advice given to you by your health care provider. Make sure you discuss any questions you have with your health care   provider.  

## 2014-02-26 NOTE — Telephone Encounter (Signed)
PT. HAD NAUSEA AND VOMITING OVER THE WEEKEND. NOTIFIED PHYSICIAN ON CALL. HE WAS INSTRUCTED TO USE NAUSEA MEDICATION WHICH HELPED AND FORCE FLUIDS. IN THE PAST 24 HOURS PT. HAS VOMITED X1, FLUID INTAKE 16 OUNCES, AND IS NOT EATING. DISCUSSED WITH PT. OTHER SOURCES OF LIQUIDS SUCH AS JELLO AND POPSICLES. PT. FELT HE COULD TAKE IN THAT TYPE OF LIQUIDS. VERBAL ORDER AND READ BACK TO DR.[place] OF NORMAL SALINE. NOTIFIED PT.

## 2014-03-04 ENCOUNTER — Telehealth: Payer: Self-pay | Admitting: Medical Oncology

## 2014-03-04 NOTE — Telephone Encounter (Signed)
Per Dr Julien Nordmann I instructed pt to take ibuprophen -he said he has been taking it and it is not helping. I told him to keep appt tomorrow.

## 2014-03-04 NOTE — Telephone Encounter (Signed)
Having increased back pain after chemotherapy . " It comes like clockwork after chemotherapy" . He received  neulasta day after.

## 2014-03-05 ENCOUNTER — Other Ambulatory Visit (HOSPITAL_BASED_OUTPATIENT_CLINIC_OR_DEPARTMENT_OTHER): Payer: BC Managed Care – PPO

## 2014-03-05 ENCOUNTER — Other Ambulatory Visit: Payer: Self-pay | Admitting: Medical Oncology

## 2014-03-05 DIAGNOSIS — C3492 Malignant neoplasm of unspecified part of left bronchus or lung: Secondary | ICD-10-CM

## 2014-03-05 DIAGNOSIS — M898X9 Other specified disorders of bone, unspecified site: Secondary | ICD-10-CM

## 2014-03-05 DIAGNOSIS — C7A1 Malignant poorly differentiated neuroendocrine tumors: Secondary | ICD-10-CM

## 2014-03-05 LAB — COMPREHENSIVE METABOLIC PANEL (CC13)
ALK PHOS: 113 U/L (ref 40–150)
ALT: 10 U/L (ref 0–55)
ANION GAP: 13 meq/L — AB (ref 3–11)
AST: 11 U/L (ref 5–34)
Albumin: 2.9 g/dL — ABNORMAL LOW (ref 3.5–5.0)
BUN: 20.6 mg/dL (ref 7.0–26.0)
CO2: 28 meq/L (ref 22–29)
Calcium: 8.8 mg/dL (ref 8.4–10.4)
Chloride: 98 mEq/L (ref 98–109)
Creatinine: 1.2 mg/dL (ref 0.7–1.3)
Glucose: 190 mg/dl — ABNORMAL HIGH (ref 70–140)
Potassium: 4 mEq/L (ref 3.5–5.1)
SODIUM: 139 meq/L (ref 136–145)
TOTAL PROTEIN: 6.3 g/dL — AB (ref 6.4–8.3)
Total Bilirubin: <0.2 mg/dL (ref 0.20–1.20)

## 2014-03-05 LAB — CBC WITH DIFFERENTIAL/PLATELET
BASO%: 0.6 % (ref 0.0–2.0)
Basophils Absolute: 0.1 10*3/uL (ref 0.0–0.1)
EOS%: 0.3 % (ref 0.0–7.0)
Eosinophils Absolute: 0.1 10*3/uL (ref 0.0–0.5)
HCT: 27.9 % — ABNORMAL LOW (ref 38.4–49.9)
HGB: 9 g/dL — ABNORMAL LOW (ref 13.0–17.1)
LYMPH%: 18.9 % (ref 14.0–49.0)
MCH: 30 pg (ref 27.2–33.4)
MCHC: 32.3 g/dL (ref 32.0–36.0)
MCV: 92.9 fL (ref 79.3–98.0)
MONO#: 1.6 10*3/uL — AB (ref 0.1–0.9)
MONO%: 6.2 % (ref 0.0–14.0)
NEUT#: 19 10*3/uL — ABNORMAL HIGH (ref 1.5–6.5)
NEUT%: 74 % (ref 39.0–75.0)
Platelets: 170 10*3/uL (ref 140–400)
RBC: 3 10*6/uL — AB (ref 4.20–5.82)
RDW: 12.6 % (ref 11.0–14.6)
WBC: 25.7 10*3/uL — AB (ref 4.0–10.3)
lymph#: 4.9 10*3/uL — ABNORMAL HIGH (ref 0.9–3.3)

## 2014-03-05 LAB — MAGNESIUM (CC13): Magnesium: 1.4 mg/dl — CL (ref 1.5–2.5)

## 2014-03-05 MED ORDER — MAGNESIUM OXIDE 400 (241.3 MG) MG PO TABS: 400.0000 mg | ORAL_TABLET | Freq: Four times a day (QID) | ORAL | Status: AC

## 2014-03-05 MED ORDER — OXYCODONE-ACETAMINOPHEN 5-325 MG PO TABS: 1.0000 | ORAL_TABLET | Freq: Four times a day (QID) | ORAL | Status: DC | PRN

## 2014-03-05 NOTE — Telephone Encounter (Signed)
Percocet rx given to pt. With specific instruction to contact pain clinic regarding  suboxone ."I am stopping it".

## 2014-03-08 ENCOUNTER — Ambulatory Visit (HOSPITAL_COMMUNITY)
Admission: RE | Admit: 2014-03-08 | Discharge: 2014-03-08 | Disposition: A | Discharge status: Home / Self Care | Payer: BC Managed Care – PPO | Source: Ambulatory Visit | Attending: Physician Assistant | Admitting: Physician Assistant

## 2014-03-08 ENCOUNTER — Encounter (HOSPITAL_COMMUNITY): Payer: Self-pay

## 2014-03-08 DIAGNOSIS — C3492 Malignant neoplasm of unspecified part of left bronchus or lung: Secondary | ICD-10-CM

## 2014-03-08 DIAGNOSIS — C3490 Malignant neoplasm of unspecified part of unspecified bronchus or lung: Secondary | ICD-10-CM | POA: Insufficient documentation

## 2014-03-08 MED ORDER — IOHEXOL 300 MG/ML  SOLN
100.0000 mL | Freq: Once | INTRAMUSCULAR | Status: AC | PRN
  Administered 2014-03-08: 100 mL via INTRAVENOUS

## 2014-03-11 ENCOUNTER — Telehealth: Payer: Self-pay | Admitting: Internal Medicine

## 2014-03-11 ENCOUNTER — Other Ambulatory Visit (HOSPITAL_BASED_OUTPATIENT_CLINIC_OR_DEPARTMENT_OTHER): Payer: BC Managed Care – PPO

## 2014-03-11 ENCOUNTER — Ambulatory Visit (HOSPITAL_BASED_OUTPATIENT_CLINIC_OR_DEPARTMENT_OTHER): Payer: BC Managed Care – PPO | Admitting: Internal Medicine

## 2014-03-11 ENCOUNTER — Encounter: Payer: Self-pay | Admitting: Internal Medicine

## 2014-03-11 VITALS — BP 108/73 | HR 135 | Temp 98.6°F | Resp 16 | Ht 70.0 in | Wt 197.3 lb

## 2014-03-11 DIAGNOSIS — T451X5A Adverse effect of antineoplastic and immunosuppressive drugs, initial encounter: Secondary | ICD-10-CM

## 2014-03-11 DIAGNOSIS — C7A1 Malignant poorly differentiated neuroendocrine tumors: Secondary | ICD-10-CM

## 2014-03-11 DIAGNOSIS — R5381 Other malaise: Secondary | ICD-10-CM

## 2014-03-11 DIAGNOSIS — C3492 Malignant neoplasm of unspecified part of left bronchus or lung: Secondary | ICD-10-CM

## 2014-03-11 DIAGNOSIS — D6481 Anemia due to antineoplastic chemotherapy: Secondary | ICD-10-CM

## 2014-03-11 DIAGNOSIS — R5383 Other fatigue: Secondary | ICD-10-CM

## 2014-03-11 LAB — COMPREHENSIVE METABOLIC PANEL (CC13)
ALBUMIN: 2.9 g/dL — AB (ref 3.5–5.0)
ALT: 6 U/L (ref 0–55)
ANION GAP: 7 meq/L (ref 3–11)
AST: 8 U/L (ref 5–34)
Alkaline Phosphatase: 100 U/L (ref 40–150)
BUN: 27.4 mg/dL — ABNORMAL HIGH (ref 7.0–26.0)
CALCIUM: 9.2 mg/dL (ref 8.4–10.4)
CHLORIDE: 98 meq/L (ref 98–109)
CO2: 32 meq/L — AB (ref 22–29)
CREATININE: 1.1 mg/dL (ref 0.7–1.3)
Glucose: 164 mg/dl — ABNORMAL HIGH (ref 70–140)
Potassium: 4.6 mEq/L (ref 3.5–5.1)
SODIUM: 138 meq/L (ref 136–145)
TOTAL PROTEIN: 6.5 g/dL (ref 6.4–8.3)
Total Bilirubin: <0.2 mg/dL (ref 0.20–1.20)

## 2014-03-11 LAB — CBC WITH DIFFERENTIAL/PLATELET
BASO%: 0.4 % (ref 0.0–2.0)
BASOS ABS: 0.1 10*3/uL (ref 0.0–0.1)
EOS ABS: 0 10*3/uL (ref 0.0–0.5)
EOS%: 0.2 % (ref 0.0–7.0)
HCT: 26.4 % — ABNORMAL LOW (ref 38.4–49.9)
HEMOGLOBIN: 8.6 g/dL — AB (ref 13.0–17.1)
LYMPH%: 18.1 % (ref 14.0–49.0)
MCH: 30.6 pg (ref 27.2–33.4)
MCHC: 32.5 g/dL (ref 32.0–36.0)
MCV: 94.1 fL (ref 79.3–98.0)
MONO#: 1.6 10*3/uL — ABNORMAL HIGH (ref 0.1–0.9)
MONO%: 9.9 % (ref 0.0–14.0)
NEUT%: 71.4 % (ref 39.0–75.0)
NEUTROS ABS: 11.8 10*3/uL — AB (ref 1.5–6.5)
PLATELETS: 422 10*3/uL — AB (ref 140–400)
RBC: 2.8 10*6/uL — ABNORMAL LOW (ref 4.20–5.82)
RDW: 13.3 % (ref 11.0–14.6)
WBC: 16.5 10*3/uL — ABNORMAL HIGH (ref 4.0–10.3)
lymph#: 3 10*3/uL (ref 0.9–3.3)

## 2014-03-11 LAB — MAGNESIUM (CC13): Magnesium: 1.7 mg/dl (ref 1.5–2.5)

## 2014-03-11 NOTE — [facility] Oncology Navigator Note (Signed)
Spoke with pt at [facility] regarding smoking cessation.  He stated he is willing to try quitting again.  He has a prescription for Chantix and will start this.  He will also brand switch for a week and quit Aug 10 th.  I will check in with pt around this time.

## 2014-03-11 NOTE — Patient Instructions (Signed)
Smoking Cessation Quitting smoking is important to your health and has many advantages. However, it is not always easy to quit since nicotine is a very addictive drug. Oftentimes, people try 3 times or more before being able to quit. This document explains the best ways for you to prepare to quit smoking. Quitting takes hard work and a lot of effort, but you can do it. ADVANTAGES OF QUITTING SMOKING  You will live longer, feel better, and live better.  Your body will feel the impact of quitting smoking almost immediately.  Within 20 minutes, blood pressure decreases. Your pulse returns to its normal level.  After 8 hours, carbon monoxide levels in the blood return to normal. Your oxygen level increases.  After 24 hours, the chance of having a heart attack starts to decrease. Your breath, hair, and body stop smelling like smoke.  After 48 hours, damaged nerve endings begin to recover. Your sense of taste and smell improve.  After 72 hours, the body is virtually free of nicotine. Your bronchial tubes relax and breathing becomes easier.  After 2 to 12 weeks, lungs can hold more air. Exercise becomes easier and circulation improves.  The risk of having a heart attack, stroke, cancer, or lung disease is greatly reduced.  After 1 year, the risk of coronary heart disease is cut in half.  After 5 years, the risk of stroke falls to the same as a nonsmoker.  After 10 years, the risk of lung cancer is cut in half and the risk of other cancers decreases significantly.  After 15 years, the risk of coronary heart disease drops, usually to the level of a nonsmoker.  If you are pregnant, quitting smoking will improve your chances of having a healthy baby.  The people you live with, especially any children, will be healthier.  You will have extra money to spend on things other than cigarettes. QUESTIONS TO THINK ABOUT BEFORE ATTEMPTING TO QUIT You may want to talk about your answers with your  health care provider.  Why do you want to quit?  If you tried to quit in the past, what helped and what did not?  What will be the most difficult situations for you after you quit? How will you plan to handle them?  Who can help you through the tough times? Your family? Friends? A health care provider?  What pleasures do you get from smoking? What ways can you still get pleasure if you quit? Here are some questions to ask your health care provider:  How can you help me to be successful at quitting?  What medicine do you think would be best for me and how should I take it?  What should I do if I need more help?  What is smoking withdrawal like? How can I get information on withdrawal? GET READY  Set a quit date.  Change your environment by getting rid of all cigarettes, ashtrays, matches, and lighters in your home, car, or work. Do not let people smoke in your home.  Review your past attempts to quit. Think about what worked and what did not. GET SUPPORT AND ENCOURAGEMENT You have a better chance of being successful if you have help. You can get support in many ways.  Tell your family, friends, and coworkers that you are going to quit and need their support. Ask them not to smoke around you.  Get individual, group, or telephone counseling and support. Programs are available at local hospitals and health centers. Call   your local health department for information about programs in your area.  Spiritual beliefs and practices may help some smokers quit.  Download a "quit meter" on your computer to keep track of quit statistics, such as how long you have gone without smoking, cigarettes not smoked, and money saved.  Get a self-help book about quitting smoking and staying off tobacco. LEARN NEW SKILLS AND BEHAVIORS  Distract yourself from urges to smoke. Talk to someone, go for a walk, or occupy your time with a task.  Change your normal routine. Take a different route to work.  Drink tea instead of coffee. Eat breakfast in a different place.  Reduce your stress. Take a hot bath, exercise, or read a book.  Plan something enjoyable to do every day. Reward yourself for not smoking.  Explore interactive web-based programs that specialize in helping you quit. GET MEDICINE AND USE IT CORRECTLY Medicines can help you stop smoking and decrease the urge to smoke. Combining medicine with the above behavioral methods and support can greatly increase your chances of successfully quitting smoking.  Nicotine replacement therapy helps deliver nicotine to your body without the negative effects and risks of smoking. Nicotine replacement therapy includes nicotine gum, lozenges, inhalers, nasal sprays, and skin patches. Some may be available over-the-counter and others require a prescription.  Antidepressant medicine helps people abstain from smoking, but how this works is unknown. This medicine is available by prescription.  Nicotinic receptor partial agonist medicine simulates the effect of nicotine in your brain. This medicine is available by prescription. Ask your health care provider for advice about which medicines to use and how to use them based on your health history. Your health care provider will tell you what side effects to look out for if you choose to be on a medicine or therapy. Carefully read the information on the package. Do not use any other product containing nicotine while using a nicotine replacement product.  RELAPSE OR DIFFICULT SITUATIONS Most relapses occur within the first 3 months after quitting. Do not be discouraged if you start smoking again. Remember, most people try several times before finally quitting. You may have symptoms of withdrawal because your body is used to nicotine. You may crave cigarettes, be irritable, feel very hungry, cough often, get headaches, or have difficulty concentrating. The withdrawal symptoms are only temporary. They are strongest  when you first quit, but they will go away within 10-14 days. To reduce the chances of relapse, try to:  Avoid drinking alcohol. Drinking lowers your chances of successfully quitting.  Reduce the amount of caffeine you consume. Once you quit smoking, the amount of caffeine in your body increases and can give you symptoms, such as a rapid heartbeat, sweating, and anxiety.  Avoid smokers because they can make you want to smoke.  Do not let weight gain distract you. Many smokers will gain weight when they quit, usually less than 10 pounds. Eat a healthy diet and stay active. You can always lose the weight gained after you quit.  Find ways to improve your mood other than smoking. FOR MORE INFORMATION  www.smokefree.gov  Document Released: 07/20/2001 Document Revised: 12/10/2013 Document Reviewed: 11/04/2011 ExitCare Patient Information 2015 [employer]. This information is not intended to replace advice given to you by your health care provider. Make sure you discuss any questions you have with your health care provider.  

## 2014-03-11 NOTE — Progress Notes (Signed)
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 Status post 2 cycles.   DISEASE STAGE:  Small cell carcinoma of left lung - extensive stage  Primary site: Lung (Left)  Staging method: AJCC 7th Edition  Clinical: Stage IV (T2b, N3, M1b) signed by Curt Bears, MD on 01/24/2014 5:08 PM  Summary: Stage IV (T2b, N3, M1b)  CHEMOTHERAPY INTENT: control  CURRENT # OF CHEMOTHERAPY CYCLES: 3 CURRENT ANTIEMETICS: Dexamethasone, Aloxi, Emend, Compazine  CURRENT SMOKING STATUS: Currently occasionally smokes  ORAL CHEMOTHERAPY AND CONSENT: n/a  CURRENT BISPHOSPHONATES USE: none  PAIN MANAGEMENT: none  NARCOTICS INDUCED CONSTIPATION:  LIVING WILL AND CODE STATUS:    INTERVAL HISTORY: Alex Hart 58 y.o. male returns to the clinic today for followup visit accompanied by his wife. The patient is tolerating his systemic chemotherapy with cisplatin and etoposide fairly well except for increasing fatigue. He also has shortness of breath but improved compared to before starting his treatment. He denied having any significant nausea or vomiting, no fever or chills. He has no significant weight loss or night sweats. He has repeat CT scan of the chest, abdomen and pelvis performed recently and he is here for evaluation and discussion of his scan results.  MEDICAL HISTORY: Past Medical History  Diagnosis Date  . Muscle spasm of back   .  Blood in stool   . Anxiety   . Arthritis   . Chronic pain syndrome   . Hx of tonsillectomy   . Bronchitis, chronic   . Hypertension   . Hyperlipidemia   . GERD (gastroesophageal reflux disease)   . COPD (chronic obstructive pulmonary disease)   . Small cell carcinoma of left lung 01/24/2014    ALLERGIES:  is allergic to chantix; azor; codeine; crestor; lipitor; lovastatin; and pravachol.  MEDICATIONS:  Current Outpatient Prescriptions  Medication Sig Dispense Refill  . ALPRAZolam (XANAX) 1 MG tablet Take 1 tablet (1 mg total) by mouth 3 (three) times daily as needed for anxiety.  90 tablet  2  . COMBIVENT RESPIMAT 20-100 MCG/ACT AERS respimat INHALE 1 PUFF INTO THE LUNGS EVERY 6 HOURS  1 Inhaler  11  . magnesium citrate SOLN Take 1 Bottle by mouth once.      . magnesium oxide (MAG-OX) 400 (241.3 MG) MG tablet Take 1 tablet (400 mg total) by mouth 4 (four) times daily.  120 tablet  0  . mometasone-formoterol (DULERA) 100-5 MCG/ACT AERO Inhale 2 puffs into the lungs 2 (two) times daily.      Marland Kitchen oxyCODONE-acetaminophen (PERCOCET/ROXICET) 5-325 MG per tablet Take 1 tablet by mouth every 6 (six) hours as needed for severe pain.  30 tablet  0  . pantoprazole (PROTONIX) 40 MG tablet Take 1 tablet (40 mg total) by mouth daily.  90 tablet  3  . prochlorperazine (COMPAZINE) 10 MG tablet Take 1 tablet (10 mg total) by  mouth every 6 (six) hours as needed for nausea or vomiting.  60 tablet  0  . senna (SENOKOT) 8.6 MG tablet Take 1 tablet by mouth daily.      . Tiotropium Bromide Monohydrate (SPIRIVA RESPIMAT) 2.5 MCG/ACT AERS 2 puffs each am  1 Inhaler  11  . triamterene-hydrochlorothiazide (MAXZIDE-25) 37.5-25 MG per tablet Take 1 tablet by mouth daily.  90 tablet  3  . buprenorphine-naloxone (SUBOXONE) 2-0.5 MG SUBL Place 1 tablet under the tongue daily.        No current facility-administered medications for this visit.    SURGICAL HISTORY:  Past Surgical History  Procedure Laterality Date    . Tonsillectomy    . Inguinal hernia repair  1990  . Back surgery  1992  . Abdominal surgery to remove adhesions from colon      teenager  . Video bronchoscopy Bilateral 12/19/2013    Procedure: VIDEO BRONCHOSCOPY WITHOUT FLUORO;  Surgeon: Tanda Rockers, MD;  Location: [facility];  Service: Cardiopulmonary;  Laterality: Bilateral;    REVIEW OF SYSTEMS:  Constitutional: positive for fatigue Eyes: negative Ears, nose, mouth, throat, and face: negative Respiratory: positive for cough and dyspnea on exertion Cardiovascular: negative Gastrointestinal: negative Genitourinary:negative Integument/breast: negative Hematologic/lymphatic: negative Musculoskeletal:negative Neurological: negative Behavioral/Psych: negative Endocrine: negative Allergic/Immunologic: negative   PHYSICAL EXAMINATION: General appearance: alert, cooperative, fatigued and no distress Head: Normocephalic, without obvious abnormality, atraumatic Neck: no adenopathy, no JVD, supple, symmetrical, trachea midline and thyroid not enlarged, symmetric, no tenderness/mass/nodules Lymph nodes: Cervical, supraclavicular, and axillary nodes normal. Resp: clear to auscultation bilaterally Back: symmetric, no curvature. ROM normal. No CVA tenderness. Cardio: regular rate and rhythm, S1, S2 normal, no murmur, click, rub or gallop GI: soft, non-tender; bowel sounds normal; no masses,  no organomegaly Extremities: extremities normal, atraumatic, no cyanosis or edema Neurologic: Alert and oriented X 3, normal strength and tone. Normal symmetric reflexes. Normal coordination and gait  ECOG PERFORMANCE STATUS: 1 - Symptomatic but completely ambulatory  Blood pressure 108/73, pulse 135, temperature 98.6 F (37 C), temperature source Oral, resp. rate 16, height 5\' 10"  (1.778 m), weight 197 lb 4.8 oz (89.495 kg), SpO2 94.00%.  LABORATORY DATA: Lab Results  Component Value Date   WBC 16.5* 03/11/2014   HGB 8.6* 03/11/2014   HCT  26.4* 03/11/2014   MCV 94.1 03/11/2014   PLT 422* 03/11/2014      Chemistry      Component Value Date/Time   NA 138 03/11/2014 1034   NA 140 11/12/2013 2015   K 4.6 03/11/2014 1034   K 4.3 11/12/2013 2015   CL 97 11/12/2013 2015   CO2 32* 03/11/2014 1034   CO2 28 11/12/2013 2015   BUN 27.4* 03/11/2014 1034   BUN 18 11/12/2013 2015   CREATININE 1.1 03/11/2014 1034   CREATININE 0.86 11/12/2013 2015      Component Value Date/Time   CALCIUM 9.2 03/11/2014 1034   CALCIUM 8.9 11/12/2013 2015   ALKPHOS 100 03/11/2014 1034   ALKPHOS 76 11/12/2013 2015   AST 8 03/11/2014 1034   AST 13 11/12/2013 2015   ALT 6 03/11/2014 1034   ALT 10 11/12/2013 2015   BILITOT <0.20 03/11/2014 1034   BILITOT 0.5 11/12/2013 2015       RADIOGRAPHIC STUDIES: Ct Chest W Contrast  03/08/2014   CLINICAL DATA:  Lung cancer.  Chemotherapy in progress.  EXAM: CT CHEST, ABDOMEN, AND PELVIS WITH CONTRAST  TECHNIQUE: Multidetector CT imaging of the chest, abdomen and pelvis was performed  following the standard protocol during bolus administration of intravenous contrast.  CONTRAST:  183mL OMNIPAQUE IOHEXOL 300 MG/ML  SOLN  COMPARISON:  PET 01/03/2014 and CT chest 11/12/2013.  FINDINGS: CT CHEST FINDINGS  Mediastinal adenopathy has decreased in size in the interval. Index right paratracheal lymph node measures 1.4 cm (previously 3.4 cm). There is left hilar lymphoid tissue without discrete nodal mass. No axillary adenopathy. Pulmonary arteries are enlarged. Three-vessel coronary artery calcification. Aortic valvular calcification. Heart size normal. No pericardial effusion.  Left upper lobe nodule measures 1.8 x 1.9 cm (previously 2.3 x 2.7 cm). Interval clearing of previously seen ill-defined peribronchovascular nodularity and ground-glass in the right upper lobe. Subpleural scarring in the right lower lobe. No pleural fluid. Dependent debris is seen in the trachea.  CT ABDOMEN AND PELVIS FINDINGS  Liver, gallbladder, adrenal glands, kidneys, spleen, pancreas,  stomach and bowel are unremarkable. Irregular soft tissue nodules in the small bowel mesentery measure up to 1.0 x 1.8 cm (Previously 2.0 x 2.1 cm). No associated calcification. No additional pathologically enlarged lymph nodes. No free fluid.  Atherosclerotic calcification of the arterial vasculature. Left common iliac artery measures 2.3 cm. Right common iliac artery measures up to 1.9 cm. Infrarenal aorta measures up to 3.8 cm. Small left inguinal hernia contains fat. Calcifications are seen in the prostate. Bladder is unremarkable. Scattered sclerotic foci in the pelvis and right femur are unchanged and presumably bone islands. Tiny lucency in the left iliac wing (image 96, series 2) is unchanged. Degenerative changes are seen in the spine.  IMPRESSION: 1. Interval response to therapy as evidenced by decrease in size of both a left upper lobe nodule and bulky mediastinal/hilar adenopathy. 2. Resolved peribronchovascular ground-glass and nodularity in the right upper lobe. 3. Mesenteric adenopathy is presumably metastatic and somewhat improved from the prior exam. Carcinoid cannot be definitively excluded. 4. Pulmonary arteries are enlarged, indicative of pulmonary arterial hypertension. 5. Three-vessel coronary artery and aortic valvular calcification. 6. Infrarenal aortic aneurysm. Left common iliac artery aneurysm. Borderline right common iliac artery aneurysm.   Electronically Signed   By: Lorin Picket M.D.   On: 03/08/2014 12:26   Ct Abdomen Pelvis W Contrast  03/08/2014   CLINICAL DATA:  Lung cancer.  Chemotherapy in progress.  EXAM: CT CHEST, ABDOMEN, AND PELVIS WITH CONTRAST  TECHNIQUE: Multidetector CT imaging of the chest, abdomen and pelvis was performed following the standard protocol during bolus administration of intravenous contrast.  CONTRAST:  176mL OMNIPAQUE IOHEXOL 300 MG/ML  SOLN  COMPARISON:  PET 01/03/2014 and CT chest 11/12/2013.  FINDINGS: CT CHEST FINDINGS  Mediastinal adenopathy  has decreased in size in the interval. Index right paratracheal lymph node measures 1.4 cm (previously 3.4 cm). There is left hilar lymphoid tissue without discrete nodal mass. No axillary adenopathy. Pulmonary arteries are enlarged. Three-vessel coronary artery calcification. Aortic valvular calcification. Heart size normal. No pericardial effusion.  Left upper lobe nodule measures 1.8 x 1.9 cm (previously 2.3 x 2.7 cm). Interval clearing of previously seen ill-defined peribronchovascular nodularity and ground-glass in the right upper lobe. Subpleural scarring in the right lower lobe. No pleural fluid. Dependent debris is seen in the trachea.  CT ABDOMEN AND PELVIS FINDINGS  Liver, gallbladder, adrenal glands, kidneys, spleen, pancreas, stomach and bowel are unremarkable. Irregular soft tissue nodules in the small bowel mesentery measure up to 1.0 x 1.8 cm (Previously 2.0 x 2.1 cm). No associated calcification. No additional pathologically enlarged lymph nodes. No free fluid.  Atherosclerotic calcification of the arterial  vasculature. Left common iliac artery measures 2.3 cm. Right common iliac artery measures up to 1.9 cm. Infrarenal aorta measures up to 3.8 cm. Small left inguinal hernia contains fat. Calcifications are seen in the prostate. Bladder is unremarkable. Scattered sclerotic foci in the pelvis and right femur are unchanged and presumably bone islands. Tiny lucency in the left iliac wing (image 96, series 2) is unchanged. Degenerative changes are seen in the spine.  IMPRESSION: 1. Interval response to therapy as evidenced by decrease in size of both a left upper lobe nodule and bulky mediastinal/hilar adenopathy. 2. Resolved peribronchovascular ground-glass and nodularity in the right upper lobe. 3. Mesenteric adenopathy is presumably metastatic and somewhat improved from the prior exam. Carcinoid cannot be definitively excluded. 4. Pulmonary arteries are enlarged, indicative of pulmonary arterial  hypertension. 5. Three-vessel coronary artery and aortic valvular calcification. 6. Infrarenal aortic aneurysm. Left common iliac artery aneurysm. Borderline right common iliac artery aneurysm.   Electronically Signed   By: Lorin Picket M.D.   On: 03/08/2014 12:26    ASSESSMENT AND PLAN: This is a 58 years old white male recently diagnosed with extensive stage small cell lung cancer currently undergoing systemic chemotherapy with cisplatin and etoposide status post 2 cycles. His recent scan showed significant improvement in his disease. I discussed the scan results and showed the images to the patient and his wife. I recommended for him to continue systemic chemotherapy with cisplatin and etoposide with the same dose and schedule. He'll start cycle #3 tomorrow. The patient would come back for followup visit in 3 weeks with the start of cycle #4.  His fatigue is most likely secondary to chemotherapy-induced anemia but he does not require any blood transfusion at this point. He was advised to call immediately if he has any concerning symptoms in the interval. The patient voices understanding of current disease status and treatment options and is in agreement with the current care plan.  All questions were answered. The patient knows to call the clinic with any problems, questions or concerns. We can certainly see the patient much sooner if necessary.  I spent 15 minutes counseling the patient face to face. The total time spent in the appointment was 25 minutes.  Disclaimer: This note was dictated with voice recognition software. Similar sounding words can inadvertently be transcribed and may not be corrected upon review.

## 2014-03-11 NOTE — Telephone Encounter (Signed)
Pt confirmed labs/ov, sent msg to add chemo per 08/03 POF, gave pt AVS....KJJ

## 2014-03-12 ENCOUNTER — Ambulatory Visit (HOSPITAL_BASED_OUTPATIENT_CLINIC_OR_DEPARTMENT_OTHER): Payer: BC Managed Care – PPO

## 2014-03-12 ENCOUNTER — Ambulatory Visit: Payer: BC Managed Care – PPO | Admitting: Nutrition

## 2014-03-12 VITALS — BP 132/82 | HR 128 | Temp 98.6°F

## 2014-03-12 DIAGNOSIS — Z5111 Encounter for antineoplastic chemotherapy: Secondary | ICD-10-CM

## 2014-03-12 DIAGNOSIS — C7A1 Malignant poorly differentiated neuroendocrine tumors: Secondary | ICD-10-CM

## 2014-03-12 DIAGNOSIS — C3492 Malignant neoplasm of unspecified part of left bronchus or lung: Secondary | ICD-10-CM

## 2014-03-12 MED ORDER — PALONOSETRON HCL INJECTION 0.25 MG/5ML
0.2500 mg | Freq: Once | INTRAVENOUS | Status: AC
  Administered 2014-03-12: 0.25 mg via INTRAVENOUS

## 2014-03-12 MED ORDER — SODIUM CHLORIDE 0.9 % IV SOLN
150.0000 mg | Freq: Once | INTRAVENOUS | Status: AC
  Administered 2014-03-12: 150 mg via INTRAVENOUS
  Filled 2014-03-12: qty 5

## 2014-03-12 MED ORDER — DEXAMETHASONE SODIUM PHOSPHATE 20 MG/5ML IJ SOLN
INTRAMUSCULAR | Status: AC
  Filled 2014-03-12: qty 5

## 2014-03-12 MED ORDER — MANNITOL 25 % IV SOLN
Freq: Once | INTRAVENOUS | Status: AC
  Administered 2014-03-12: 09:00:00 via INTRAVENOUS
  Filled 2014-03-12: qty 10

## 2014-03-12 MED ORDER — PALONOSETRON HCL INJECTION 0.25 MG/5ML
INTRAVENOUS | Status: AC
  Filled 2014-03-12: qty 5

## 2014-03-12 MED ORDER — DEXAMETHASONE SODIUM PHOSPHATE 20 MG/5ML IJ SOLN
12.0000 mg | Freq: Once | INTRAMUSCULAR | Status: AC
  Administered 2014-03-12: 12 mg via INTRAVENOUS

## 2014-03-12 MED ORDER — SODIUM CHLORIDE 0.9 % IV SOLN
60.0000 mg/m2 | Freq: Once | INTRAVENOUS | Status: AC
  Administered 2014-03-12: 127 mg via INTRAVENOUS
  Filled 2014-03-12: qty 127

## 2014-03-12 MED ORDER — SODIUM CHLORIDE 0.9 % IV SOLN
120.0000 mg/m2 | Freq: Once | INTRAVENOUS | Status: AC
  Administered 2014-03-12: 250 mg via INTRAVENOUS
  Filled 2014-03-12: qty 12.5

## 2014-03-12 NOTE — Patient Instructions (Signed)
[facility] Discharge Instructions for Patients Receiving Chemotherapy  Today you received the following chemotherapy agents: Cisplatin and Etoposide.  To help prevent nausea and vomiting after your treatment, we encourage you to take your nausea medication: compazine 10mg  every 6 hours as needed.   If you develop nausea and vomiting that is not controlled by your nausea medication, call the clinic.   BELOW ARE SYMPTOMS THAT SHOULD BE REPORTED IMMEDIATELY:  *FEVER GREATER THAN 100.5 F  *CHILLS WITH OR WITHOUT FEVER  NAUSEA AND VOMITING THAT IS NOT CONTROLLED WITH YOUR NAUSEA MEDICATION  *UNUSUAL SHORTNESS OF BREATH  *UNUSUAL BRUISING OR BLEEDING  TENDERNESS IN MOUTH AND THROAT WITH OR WITHOUT PRESENCE OF ULCERS  *URINARY PROBLEMS  *BOWEL PROBLEMS  UNUSUAL RASH Items with * indicate a potential emergency and should be followed up as soon as possible.  Feel free to call the clinic you have any questions or concerns. The clinic phone number is [phone].

## 2014-03-12 NOTE — Progress Notes (Signed)
58 year old male diagnosed with small cell lung cancer.  He is a patient of Dr. Earlie Server.  Past medical history includes anxiety, arthritis, hypertension, hyperlipidemia, GERD, and COPD.  Medications include Xanax, magnesium oxide, Protonix, and Compazine.  Labs include glucose 190, albumin 2.9 on July 28.  Height: 5 feet 10 inches. Weight: 197.3 pounds August 3. Usual body weight: 215 pounds April 2015. BMI: 28.31.  Patient identified to be a positive risk for malnutrition on the malnutrition risk screen secondary to weight loss and poor appetite.  Patient reports he does struggle with oral intake after chemotherapy for a few days.  He loses his appetite.  Complains of fatigue but denies other nutrition side effects at this time.  Patient endorses severe weight loss over several months.  Nutrition diagnosis: Unintended weight loss related to poor appetite associated with small cell lung cancer and associated treatments as evidenced by 8 percent weight loss in 3 months.  Intervention: Patient educated to consume smaller, more frequent meals and snacks with high protein foods throughout the day.   Educated patient on foods that do have high protein.   Educated patient on strategies for increasing iron containing foods in his diet.   Encouraged patient to strive for weight maintenance.   Fact sheets were provided.   Questions were answered and teach back method used.  Monitoring, evaluation, goals: Patient will tolerate adequate calories and protein to promote weight maintenance.  Next visit: Tuesday, August 25 during chemotherapy.  Patient will contact me with questions or concerns if needed before appointment.  **Disclaimer: This note was dictated with voice recognition software. Similar sounding words can inadvertently be transcribed and this note may contain transcription errors which may not have been corrected upon publication of note.**

## 2014-03-13 ENCOUNTER — Other Ambulatory Visit: Payer: Self-pay | Admitting: Internal Medicine

## 2014-03-13 ENCOUNTER — Ambulatory Visit (HOSPITAL_BASED_OUTPATIENT_CLINIC_OR_DEPARTMENT_OTHER): Payer: BC Managed Care – PPO

## 2014-03-13 VITALS — BP 134/68 | HR 120 | Temp 98.1°F | Resp 20

## 2014-03-13 DIAGNOSIS — Z5111 Encounter for antineoplastic chemotherapy: Secondary | ICD-10-CM

## 2014-03-13 DIAGNOSIS — C3492 Malignant neoplasm of unspecified part of left bronchus or lung: Secondary | ICD-10-CM

## 2014-03-13 DIAGNOSIS — C7A1 Malignant poorly differentiated neuroendocrine tumors: Secondary | ICD-10-CM

## 2014-03-13 MED ORDER — SODIUM CHLORIDE 0.9 % IV SOLN
120.0000 mg/m2 | Freq: Once | INTRAVENOUS | Status: AC
  Administered 2014-03-13: 250 mg via INTRAVENOUS
  Filled 2014-03-13: qty 12.5

## 2014-03-13 MED ORDER — DEXAMETHASONE SODIUM PHOSPHATE 10 MG/ML IJ SOLN
10.0000 mg | Freq: Once | INTRAMUSCULAR | Status: AC
  Administered 2014-03-13: 10 mg via INTRAVENOUS

## 2014-03-13 MED ORDER — SODIUM CHLORIDE 0.9 % IV SOLN
Freq: Once | INTRAVENOUS | Status: AC
  Administered 2014-03-13: 14:00:00 via INTRAVENOUS

## 2014-03-13 MED ORDER — DEXAMETHASONE SODIUM PHOSPHATE 10 MG/ML IJ SOLN
INTRAMUSCULAR | Status: AC
  Filled 2014-03-13: qty 1

## 2014-03-13 NOTE — Patient Instructions (Signed)
[facility] Discharge Instructions for Patients Receiving Chemotherapy  Today you received the following chemotherapy agents: Etposide  To help prevent nausea and vomiting after your treatment, we encourage you to take your nausea medication as prescribed.    If you develop nausea and vomiting that is not controlled by your nausea medication, call the clinic.   BELOW ARE SYMPTOMS THAT SHOULD BE REPORTED IMMEDIATELY:  *FEVER GREATER THAN 100.5 F  *CHILLS WITH OR WITHOUT FEVER  NAUSEA AND VOMITING THAT IS NOT CONTROLLED WITH YOUR NAUSEA MEDICATION  *UNUSUAL SHORTNESS OF BREATH  *UNUSUAL BRUISING OR BLEEDING  TENDERNESS IN MOUTH AND THROAT WITH OR WITHOUT PRESENCE OF ULCERS  *URINARY PROBLEMS  *BOWEL PROBLEMS  UNUSUAL RASH Items with * indicate a potential emergency and should be followed up as soon as possible.  Feel free to call the clinic you have any questions or concerns. The clinic phone number is [phone].

## 2014-03-14 ENCOUNTER — Ambulatory Visit (HOSPITAL_BASED_OUTPATIENT_CLINIC_OR_DEPARTMENT_OTHER): Payer: BC Managed Care – PPO

## 2014-03-14 ENCOUNTER — Encounter: Payer: Self-pay | Admitting: *Deleted

## 2014-03-14 VITALS — BP 132/76 | HR 128 | Temp 98.2°F | Resp 19

## 2014-03-14 DIAGNOSIS — Z5111 Encounter for antineoplastic chemotherapy: Secondary | ICD-10-CM

## 2014-03-14 DIAGNOSIS — C7A1 Malignant poorly differentiated neuroendocrine tumors: Secondary | ICD-10-CM

## 2014-03-14 DIAGNOSIS — C3492 Malignant neoplasm of unspecified part of left bronchus or lung: Secondary | ICD-10-CM

## 2014-03-14 MED ORDER — ETOPOSIDE CHEMO INJECTION 1 GM/50ML
120.0000 mg/m2 | Freq: Once | INTRAVENOUS | Status: AC
  Administered 2014-03-14: 250 mg via INTRAVENOUS
  Filled 2014-03-14: qty 12.5

## 2014-03-14 MED ORDER — DEXAMETHASONE SODIUM PHOSPHATE 10 MG/ML IJ SOLN
10.0000 mg | Freq: Once | INTRAMUSCULAR | Status: AC
  Administered 2014-03-14: 10 mg via INTRAVENOUS

## 2014-03-14 MED ORDER — SODIUM CHLORIDE 0.9 % IV SOLN
Freq: Once | INTRAVENOUS | Status: AC
  Administered 2014-03-14: 15:00:00 via INTRAVENOUS

## 2014-03-14 MED ORDER — DEXAMETHASONE SODIUM PHOSPHATE 10 MG/ML IJ SOLN
INTRAMUSCULAR | Status: AC
  Filled 2014-03-14: qty 1

## 2014-03-14 NOTE — Progress Notes (Signed)
[facility] Social Work  Clinical Social Work was referred by patient's spouse to review and complete healthcare advance directives.  Clinical Social Worker met with patient and patient's sister in [place] office.  The patient designated Norm Wray, spouse, as their primary healthcare agent and Carrington Clamp, sister, as their secondary agent.  Patient also completed healthcare living will.    Clinical Social Worker notarized documents and made copies for patient/family. Clinical Social Worker will send documents to medical records to be scanned into patient's chart. Clinical Social Worker encouraged patient/family to contact with any additional questions or concerns.  Polo Riley, MSW, [place] Worker [facility] [phone]

## 2014-03-14 NOTE — Patient Instructions (Signed)
[facility] Discharge Instructions for Patients Receiving Chemotherapy  Today you received the following chemotherapy agents: Etoposide   To help prevent nausea and vomiting after your treatment, we encourage you to take your nausea medication as prescribed by your physician.    If you develop nausea and vomiting that is not controlled by your nausea medication, call the clinic.   BELOW ARE SYMPTOMS THAT SHOULD BE REPORTED IMMEDIATELY:  *FEVER GREATER THAN 100.5 F  *CHILLS WITH OR WITHOUT FEVER  NAUSEA AND VOMITING THAT IS NOT CONTROLLED WITH YOUR NAUSEA MEDICATION  *UNUSUAL SHORTNESS OF BREATH  *UNUSUAL BRUISING OR BLEEDING  TENDERNESS IN MOUTH AND THROAT WITH OR WITHOUT PRESENCE OF ULCERS  *URINARY PROBLEMS  *BOWEL PROBLEMS  UNUSUAL RASH Items with * indicate a potential emergency and should be followed up as soon as possible.  Feel free to call the clinic you have any questions or concerns. The clinic phone number is [phone].

## 2014-03-15 ENCOUNTER — Ambulatory Visit (HOSPITAL_BASED_OUTPATIENT_CLINIC_OR_DEPARTMENT_OTHER): Payer: BC Managed Care – PPO

## 2014-03-15 VITALS — BP 131/79 | HR 118 | Temp 98.0°F

## 2014-03-15 DIAGNOSIS — C7A1 Malignant poorly differentiated neuroendocrine tumors: Secondary | ICD-10-CM

## 2014-03-15 DIAGNOSIS — Z5189 Encounter for other specified aftercare: Secondary | ICD-10-CM

## 2014-03-15 DIAGNOSIS — C3492 Malignant neoplasm of unspecified part of left bronchus or lung: Secondary | ICD-10-CM

## 2014-03-15 MED ORDER — PEGFILGRASTIM INJECTION 6 MG/0.6ML
6.0000 mg | Freq: Once | SUBCUTANEOUS | Status: AC
  Administered 2014-03-15: 6 mg via SUBCUTANEOUS
  Filled 2014-03-15: qty 0.6

## 2014-03-19 ENCOUNTER — Other Ambulatory Visit (HOSPITAL_BASED_OUTPATIENT_CLINIC_OR_DEPARTMENT_OTHER): Payer: BC Managed Care – PPO

## 2014-03-19 DIAGNOSIS — C3492 Malignant neoplasm of unspecified part of left bronchus or lung: Secondary | ICD-10-CM

## 2014-03-19 DIAGNOSIS — C7A1 Malignant poorly differentiated neuroendocrine tumors: Secondary | ICD-10-CM

## 2014-03-19 LAB — CBC WITH DIFFERENTIAL/PLATELET
BASO%: 0.3 % (ref 0.0–2.0)
Basophils Absolute: 0 10*3/uL (ref 0.0–0.1)
EOS%: 0.3 % (ref 0.0–7.0)
Eosinophils Absolute: 0 10*3/uL (ref 0.0–0.5)
HCT: 22.3 % — ABNORMAL LOW (ref 38.4–49.9)
HGB: 7.3 g/dL — ABNORMAL LOW (ref 13.0–17.1)
LYMPH%: 10.1 % — AB (ref 14.0–49.0)
MCH: 30.5 pg (ref 27.2–33.4)
MCHC: 32.5 g/dL (ref 32.0–36.0)
MCV: 94 fL (ref 79.3–98.0)
MONO#: 0.2 10*3/uL (ref 0.1–0.9)
MONO%: 1.5 % (ref 0.0–14.0)
NEUT#: 11.3 10*3/uL — ABNORMAL HIGH (ref 1.5–6.5)
NEUT%: 87.8 % — ABNORMAL HIGH (ref 39.0–75.0)
Platelets: 159 10*3/uL (ref 140–400)
RBC: 2.37 10*6/uL — AB (ref 4.20–5.82)
RDW: 13.8 % (ref 11.0–14.6)
WBC: 12.8 10*3/uL — AB (ref 4.0–10.3)
lymph#: 1.3 10*3/uL (ref 0.9–3.3)

## 2014-03-19 LAB — COMPREHENSIVE METABOLIC PANEL (CC13)
ALK PHOS: 158 U/L — AB (ref 40–150)
AST: 8 U/L (ref 5–34)
Albumin: 3.3 g/dL — ABNORMAL LOW (ref 3.5–5.0)
Anion Gap: 10 mEq/L (ref 3–11)
BILIRUBIN TOTAL: 1.15 mg/dL (ref 0.20–1.20)
BUN: 47.7 mg/dL — ABNORMAL HIGH (ref 7.0–26.0)
CO2: 34 mEq/L — ABNORMAL HIGH (ref 22–29)
CREATININE: 1.2 mg/dL (ref 0.7–1.3)
Calcium: 9.6 mg/dL (ref 8.4–10.4)
Chloride: 92 mEq/L — ABNORMAL LOW (ref 98–109)
Glucose: 163 mg/dl — ABNORMAL HIGH (ref 70–140)
Potassium: 4.7 mEq/L (ref 3.5–5.1)
SODIUM: 136 meq/L (ref 136–145)
TOTAL PROTEIN: 6.5 g/dL (ref 6.4–8.3)

## 2014-03-19 LAB — MAGNESIUM (CC13): Magnesium: 1.8 mg/dl (ref 1.5–2.5)

## 2014-03-20 NOTE — Progress Notes (Signed)
Quick Note:  Call patient with the result and arrange for 2 units of PRBCs transfusion. ______

## 2014-03-21 ENCOUNTER — Other Ambulatory Visit: Payer: Self-pay | Admitting: Medical Oncology

## 2014-03-21 ENCOUNTER — Telehealth: Payer: Self-pay | Admitting: Medical Oncology

## 2014-03-21 ENCOUNTER — Telehealth: Payer: Self-pay | Admitting: Internal Medicine

## 2014-03-21 ENCOUNTER — Encounter: Payer: Self-pay | Admitting: Medical Oncology

## 2014-03-21 DIAGNOSIS — D649 Anemia, unspecified: Secondary | ICD-10-CM

## 2014-03-21 NOTE — Telephone Encounter (Signed)
pe r8/13/15 pof sent @ 6:24pm pt needs lb/blood 03/22/14. message to chemo scheduler and inf manager re added appts and calling pt. no other orders per pof.

## 2014-03-21 NOTE — Telephone Encounter (Signed)
Called pt to schedule transfusion in am.

## 2014-03-22 ENCOUNTER — Other Ambulatory Visit: Payer: BC Managed Care – PPO

## 2014-03-22 ENCOUNTER — Encounter: Payer: Self-pay | Admitting: *Deleted

## 2014-03-22 ENCOUNTER — Ambulatory Visit (HOSPITAL_BASED_OUTPATIENT_CLINIC_OR_DEPARTMENT_OTHER): Payer: BC Managed Care – PPO

## 2014-03-22 ENCOUNTER — Other Ambulatory Visit: Payer: Self-pay | Admitting: Medical Oncology

## 2014-03-22 ENCOUNTER — Ambulatory Visit (HOSPITAL_COMMUNITY)
Admission: RE | Admit: 2014-03-22 | Discharge: 2014-03-22 | Disposition: A | Discharge status: Home / Self Care | Payer: BC Managed Care – PPO | Source: Ambulatory Visit | Attending: Internal Medicine | Admitting: Internal Medicine

## 2014-03-22 ENCOUNTER — Ambulatory Visit: Payer: BC Managed Care – PPO

## 2014-03-22 VITALS — BP 120/82 | HR 127 | Temp 98.2°F | Resp 25

## 2014-03-22 DIAGNOSIS — C3490 Malignant neoplasm of unspecified part of unspecified bronchus or lung: Secondary | ICD-10-CM

## 2014-03-22 DIAGNOSIS — D649 Anemia, unspecified: Secondary | ICD-10-CM | POA: Insufficient documentation

## 2014-03-22 DIAGNOSIS — R059 Cough, unspecified: Secondary | ICD-10-CM

## 2014-03-22 DIAGNOSIS — R05 Cough: Secondary | ICD-10-CM

## 2014-03-22 LAB — ABO/RH: ABO/RH(D): O POS

## 2014-03-22 LAB — PREPARE RBC (CROSSMATCH)

## 2014-03-22 MED ORDER — SODIUM CHLORIDE 0.9 % IV SOLN
250.0000 mL | Freq: Once | INTRAVENOUS | Status: AC
  Administered 2014-03-22: 250 mL via INTRAVENOUS

## 2014-03-22 MED ORDER — DIPHENHYDRAMINE HCL 25 MG PO CAPS
25.0000 mg | ORAL_CAPSULE | Freq: Once | ORAL | Status: AC
  Administered 2014-03-22: 25 mg via ORAL

## 2014-03-22 MED ORDER — HYDROCODONE-HOMATROPINE 5-1.5 MG/5ML PO SYRP: 5.0000 mL | ORAL_SOLUTION | Freq: Four times a day (QID) | ORAL | Status: DC | PRN

## 2014-03-22 MED ORDER — ACETAMINOPHEN 325 MG PO TABS
ORAL_TABLET | ORAL | Status: AC
  Filled 2014-03-22: qty 2

## 2014-03-22 MED ORDER — DIPHENHYDRAMINE HCL 25 MG PO CAPS
ORAL_CAPSULE | ORAL | Status: AC
  Filled 2014-03-22: qty 1

## 2014-03-22 MED ORDER — ACETAMINOPHEN 325 MG PO TABS
650.0000 mg | ORAL_TABLET | Freq: Once | ORAL | Status: AC
  Administered 2014-03-22: 650 mg via ORAL

## 2014-03-22 NOTE — Progress Notes (Signed)
HAR done

## 2014-03-22 NOTE — Telephone Encounter (Signed)
Hycodan given to pt.

## 2014-03-22 NOTE — Patient Instructions (Signed)

## 2014-03-23 LAB — TYPE AND SCREEN
ABO/RH(D): O POS
ANTIBODY SCREEN: NEGATIVE
Unit division: 0
Unit division: 0

## 2014-03-25 ENCOUNTER — Encounter: Payer: Self-pay | Admitting: Internal Medicine

## 2014-03-25 ENCOUNTER — Telehealth: Payer: Self-pay | Admitting: *Deleted

## 2014-03-25 ENCOUNTER — Ambulatory Visit (INDEPENDENT_AMBULATORY_CARE_PROVIDER_SITE_OTHER)
Admission: RE | Admit: 2014-03-25 | Discharge: 2014-03-25 | Disposition: A | Discharge status: Home / Self Care | Payer: BC Managed Care – PPO | Source: Ambulatory Visit | Attending: Internal Medicine | Admitting: Internal Medicine

## 2014-03-25 ENCOUNTER — Ambulatory Visit (INDEPENDENT_AMBULATORY_CARE_PROVIDER_SITE_OTHER): Payer: BC Managed Care – PPO | Admitting: Internal Medicine

## 2014-03-25 ENCOUNTER — Telehealth: Payer: Self-pay | Admitting: Internal Medicine

## 2014-03-25 VITALS — BP 104/56 | HR 120 | Temp 97.1°F | Ht 71.0 in

## 2014-03-25 DIAGNOSIS — I10 Essential (primary) hypertension: Secondary | ICD-10-CM

## 2014-03-25 DIAGNOSIS — J9611 Chronic respiratory failure with hypoxia: Secondary | ICD-10-CM

## 2014-03-25 DIAGNOSIS — J449 Chronic obstructive pulmonary disease, unspecified: Secondary | ICD-10-CM

## 2014-03-25 DIAGNOSIS — J189 Pneumonia, unspecified organism: Secondary | ICD-10-CM | POA: Insufficient documentation

## 2014-03-25 MED ORDER — LEVOFLOXACIN 750 MG PO TABS: 750.0000 mg | ORAL_TABLET | Freq: Every day | ORAL | Status: DC

## 2014-03-25 MED ORDER — PREDNISONE 10 MG PO TABS: ORAL_TABLET | ORAL | Status: AC

## 2014-03-25 MED ORDER — FLUTTER DEVI: Status: AC

## 2014-03-25 MED ORDER — METHYLPREDNISOLONE ACETATE 80 MG/ML IJ SUSP
120.0000 mg | Freq: Once | INTRAMUSCULAR | Status: AC
  Administered 2014-03-25: 120 mg via INTRAMUSCULAR

## 2014-03-25 MED ORDER — TIOTROPIUM BROMIDE-OLODATEROL 2.5-2.5 MCG/ACT IN AERS: 2.0000 | INHALATION_SPRAY | Freq: Every morning | RESPIRATORY_TRACT | Status: AC

## 2014-03-25 NOTE — Telephone Encounter (Signed)
Spoke with pt's wife.  She states that pt is having increased SOB.  Sats dropping to 84-85 on oxygen at 4L.  She spoke with Dr Lew Dawes office and they think this may be from his copd and not the lung cancer.  Pt given appt with Dr wert today.

## 2014-03-25 NOTE — Assessment & Plan Note (Signed)
-   s/p smoking cessation 03/20/14  - PFT's 12/07/12 FEV1  1.12 (33%) ratio 49 and 17% improved p B2, DLCO 65 improved to 92% p correction  DDX of  difficult airways management all start with A and  include Adherence, Ace Inhibitors, Acid Reflux, Active Sinus Disease, Alpha 1 Antitripsin deficiency, Anxiety masquerading as Airways dz,  ABPA,  allergy(esp in young), Aspiration (esp in elderly), Adverse effects of DPI,  Active smokers, plus two Bs  = Bronchiectasis and Beta blocker use..and one C= CHF  Adherence is always the initial "prime suspect" and is a multilayered concern that requires a "trust but verify" approach in every patient - starting with knowing how to use medications, especially inhalers, correctly, keeping up with refills and understanding the fundamental difference between maintenance and prns vs those medications only taken for a very short course and then stopped and not refilled.  The proper method of use, as well as anticipated side effects, of a metered-dose inhaler are discussed and demonstrated to the patient. Improved effectiveness after extensive coaching during this visit to a level of approximately  90% with respimat, not as good with hfa so change to stiolto and pred 20 mg daily until better then 10 mg per day until seen   ? Acid (or non-acid) GERD > always difficult to exclude as up to 75% of pts in some series report no assoc GI/ Heartburn symptoms> rec max (24h)  acid suppression and diet restrictions/ reviewed and instructions given in writing.   ? Active smoking > emphasized maintain off

## 2014-03-25 NOTE — Progress Notes (Signed)
Quick Note:  ATC pt to schedule 1 wk follow up with MW with cxr - went directly to VM. [id number] ______

## 2014-03-25 NOTE — Progress Notes (Signed)
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 rec Plan A = automatic = symbicort 160 Take 2 puffs first thing in am and then another 2 puffs about 12 hours later.  Only use your albuterol as a rescue medication   12/22/2012 f/u ov/Rasul Decola re GOLD III COPD/ still smoking Chief Complaint  Patient presents with  . Follow-up    Pt states breathing is unchanged since the last visit and denies any new co's.   If you stop smokiing now, your lung function should level off where it is - this is the most important aspect of your care Continue symbicort 160 Take 2 puffs first thing in am and then another 2 puffs about 12 hours later.  Only use your combivent as a rescue medication    06/27/2013 f/u ov/Miyana Mordecai re: COPD GOLD III/ still smoking  Chief Complaint  Patient presents with  . Follow-up    Pt c/o SOB with any exertion, postnasal drainage, sometimes prod cough with yellow mucous X81m.  Pt states he is overusing rescue inhaler, believes it is is due to him panicking.  For months using more than twice daily combivent  Stop combivent While on prednisone double your fluid/blood pressure pill to offset fluid retention Change the protonix to 40 mg Take 30- 60 min before your first and last meals of the day until breathing/ coughing better then just one before bfast daily  Augmentin 875 mg take one pill twice daily  X 10 days - take at breakfast and supper with large glass of water.  It would help reduce the usual side effects (diarrhea and yeast infections) if you ate cultured yogurt at lunch.  Prednisone 10 mg take  4 each am  x 2 days,   2 each am x 2 days,  1 each am x 2 days and stop Plan A = automatic= symbicort 160 Take 2 puffs first thing in am and then another 2 puffs about 12 hours later.  And tudorza one twice daily Plan B= Backup= proair (albuterol) ok up to 2 puffs every 4 hours but if you need it more than twice daily you need to return here  Plan C = nebulizer (hold off for now and work on maintaining off cigarettes) Plan D = Doctor, call if needed Plan E = ER, if all else fails Monitor your blood pressure carefully at home and call Dr Alain Marion if over 140/85   11/15/2013 Post ER l follow up  Was seen in ER on 4/6/ for cough , congestion and fever-102 x 5 days. In ER cxr showed 4.4 cm masslike density in left hilum. Subsequent CT showed 4x 8.7 cm left nodal conglomeration and 2.6 cm lul nodule. W/ patchy tree in bud infitrate throughout lungs. Bulky medistinal lymphadenopathy.  He was started on Levaquin  And prednisone  He is feeling some better but still weak  rec Finish Levaquin and Prednisone as directed.  Mucinex DM Twice daily  As needed  Cough/congestion  Fluids and rest    11/22/2013 post ER f/u ov/Abdulhamid Olgin re: GOLD III copd/ sp ER eval  for fever rx levaquin x 10 d, pred pak Chief Complaint  Patient presents with  . Follow-up    Pt reports his breathing has improved, but not back to baseline. His cough is unchanged. No new co's today. He is using rescue inhaler at least once per day.  fever resolved, no hemoptysis / quit smoking since ER eval  rec If condition worsens >  Augmentin 875 mg take one pill twice daily  X 10 days - take at breakfast and supper with large glass of water.  It would help reduce the usual side effects (diarrhea and yeast infections) if you ate cultured yogurt at lunch.  No work until seen in follow up (last worked 11/11/13)  For cough mucinex dm 1200 mg every 12 hours as needed  Please schedule a follow up office visit in 2  weeks, sooner if needed with cxr    12/07/2013  f/u ov/Mykelle Cockerell re: pna vs ca LUL/qualifying for 24 h 02 / no longer smoking / never took augmentin Chief Complaint  Patient presents with  . Follow-up    Pt states that his SOB is about the same. Cough is still prod, but started to produce yellow to green sputum about 1 wk ago. Using rescue inhaler approx 4 times per day.   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 Follow-up    Pt states his breathing is unchanged since last visit. Voice is becoming more hoarse and he is having some difficulty with swallowing.   No hemoptysis/ no purulent sputum.  Very sedentary but no sob on 02  rec Add spiriva         02/18/2014 f/u ov/Glenisha Gundry re:  02 3lpm 24/7  dulera 100/ spiriva and comibent rarely needed  Chief Complaint  Patient presents with  . Follow-up    Pt states that his breathing is unchanged since his last visit. No new co's today.   Now on 02 2lpm can walk slow up an aisle at [employer] but that's about it.  rec No change in pulmonary medications or 02 Pneumovax today    03/25/2014 f/u ov/Cristi Gwynn re: copd/ quit smoking x 5 days Chief Complaint  Patient presents with  . Acute Visit    o2 sat 89% on 2lpm upon arrival to exam room via wheelchair.  c/o increased SOB x 1 wk, wheezing, and chest tightness.    mostly 3lpm, occ need Sleeps R side down Cough with white phlegm only, no fever.    No obvious day to day or daytime variabilty  Cp  chest tightness,  subjective wheeze overt sinus or hb symptoms. No unusual exp hx or h/o childhood pna/ asthma or knowledge of premature birth.  Sleeping ok without nocturnal  or early am exacerbation  of respiratory  c/o's or need for noct saba. Also denies any obvious fluctuation of symptoms with weather or environmental changes or other aggravating or alleviating factors except  as outlined above   Current Medications, Allergies, Complete Past Medical History, Past Surgical History, Family History, and Social History were reviewed in [employer] record.  ROS  The following are not active complaints unless bolded sore throat, dysphagia, dental problems, itching, sneezing,  nasal congestion or excess/ purulent secretions, ear ache,   fever, chills, sweats, unintended wt loss, pleuritic or exertional cp, hemoptysis,  orthopnea pnd or leg swelling, presyncope, palpitations, heartburn, abdominal pain, anorexia, nausea, vomiting, diarrhea  or change in bowel or urinary habits, change in stools or urine, dysuria,hematuria,  rash, arthralgias, visual complaints, headache, numbness weakness or ataxia or problems with walking or coordination,  change in mood/affect or memory.                 Objective:   Physical Exam  amb wm nad     06/27/2013     230  > 08/27/2013  216  >210 11/15/2013 > 11/22/2013  206 > 12/08/2013 206 > 01/09/2014  205 >  02/18/14 199 > 03/25/2014  No wt   HEENT mild turbinate edema.  Oropharynx no thrush or excess pnd or cobblestoning.  No JVD or cervical adenopathy. Mild accessory muscle hypertrophy. Trachea midline, nl thryroid. Chest was hyperinflated by percussion with a bilateral exp rhonchi Regular rate and rhythm without murmur gallop or rub or increase P2 or edema.  Abd: no hsm, nl excursion. Ext warm without cyanosis - POS mild clubbing       CXR  03/25/2014 :   Improving left apical mass, mediastinal and left hilar adenopathy.  COPD.  Patchy opacities in the right lower  lung, question early pneumonia.     Assessment & Plan:

## 2014-03-25 NOTE — Telephone Encounter (Signed)
Pt's wife called wanting to discuss pt's status with patient.  He had a transfusion on Friday.  His O2 level has been in the 60-70's on 2 L.  They increased his O2 level to 4L and his O2 sat increased to 85%.  He is very SOB and cannot walk very much.  No fever.  He had a cough that he is taking hycodan for and this has helped.  When he was coughing his sputum was clear.  Per Dr Vista Mink, pt needs to call his pulmonologist to have his COPD evaluated.  If he continues to get worse he needs to go to the ED to be evaluated.  Pt's wife verbalized understanding. SLJ

## 2014-03-25 NOTE — Assessment & Plan Note (Signed)
Adequate control on present rx, reviewed > no change in rx needed   

## 2014-03-25 NOTE — Assessment & Plan Note (Signed)
Hold diuretic until po intake is nl again

## 2014-03-25 NOTE — Patient Instructions (Addendum)
Start stiolto 2 puffs each am  Stop combivent, the spiriva and dulera Add Pepcid ac 20 mg at bedtime (over the counter  For cough use mucinex or mucinex dm up to 1200 mg every 12 hours and use flutter valve as much as you can   Depomedrol 120 mg today   Only use your albuterol (proair) as a rescue medication to be used if you can't catch your breath by resting or doing a relaxed purse lip breathing pattern.  - The less you use it, the better it will work when you need it. - Ok to use up to 2 puffs  every 4 hours if you must but call for immediate appointment if use goes up over your usual need - Don't leave home without it !!  (think of it like the spare tire for your car)   Prednsionse 10 x 2 each am until feel back to normal, then reduce to 1 each am  Please remember to go to the  x-ray department downstairs for your tests - we will call you with the results when they are available.  Please schedule a follow up office visit in 2 weeks, sooner if needed

## 2014-03-26 ENCOUNTER — Other Ambulatory Visit (HOSPITAL_BASED_OUTPATIENT_CLINIC_OR_DEPARTMENT_OTHER): Payer: BC Managed Care – PPO

## 2014-03-26 ENCOUNTER — Telehealth: Payer: Self-pay | Admitting: *Deleted

## 2014-03-26 DIAGNOSIS — C7A1 Malignant poorly differentiated neuroendocrine tumors: Secondary | ICD-10-CM

## 2014-03-26 DIAGNOSIS — C3492 Malignant neoplasm of unspecified part of left bronchus or lung: Secondary | ICD-10-CM

## 2014-03-26 LAB — CBC WITH DIFFERENTIAL/PLATELET
BASO%: 0.4 % (ref 0.0–2.0)
BASOS ABS: 0 10*3/uL (ref 0.0–0.1)
EOS%: 0 % (ref 0.0–7.0)
Eosinophils Absolute: 0 10*3/uL (ref 0.0–0.5)
HCT: 26.5 % — ABNORMAL LOW (ref 38.4–49.9)
HEMOGLOBIN: 8.7 g/dL — AB (ref 13.0–17.1)
LYMPH%: 15 % (ref 14.0–49.0)
MCH: 30.8 pg (ref 27.2–33.4)
MCHC: 32.8 g/dL (ref 32.0–36.0)
MCV: 93.8 fL (ref 79.3–98.0)
MONO#: 1.4 10*3/uL — ABNORMAL HIGH (ref 0.1–0.9)
MONO%: 12.9 % (ref 0.0–14.0)
NEUT#: 7.7 10*3/uL — ABNORMAL HIGH (ref 1.5–6.5)
NEUT%: 71.7 % (ref 39.0–75.0)
Platelets: 88 10*3/uL — ABNORMAL LOW (ref 140–400)
RBC: 2.82 10*6/uL — ABNORMAL LOW (ref 4.20–5.82)
RDW: 13.4 % (ref 11.0–14.6)
WBC: 10.7 10*3/uL — ABNORMAL HIGH (ref 4.0–10.3)
lymph#: 1.6 10*3/uL (ref 0.9–3.3)

## 2014-03-26 LAB — COMPREHENSIVE METABOLIC PANEL (CC13)
ALT: <6 U/L (ref 0–55)
AST: 8 U/L (ref 5–34)
Albumin: 3.2 g/dL — ABNORMAL LOW (ref 3.5–5.0)
Alkaline Phosphatase: 107 U/L (ref 40–150)
Anion Gap: 12 mEq/L — ABNORMAL HIGH (ref 3–11)
BUN: 40.7 mg/dL — AB (ref 7.0–26.0)
CO2: 37 mEq/L — ABNORMAL HIGH (ref 22–29)
CREATININE: 2 mg/dL — AB (ref 0.7–1.3)
Calcium: 10.3 mg/dL (ref 8.4–10.4)
Chloride: 86 mEq/L — ABNORMAL LOW (ref 98–109)
Glucose: 181 mg/dl — ABNORMAL HIGH (ref 70–140)
POTASSIUM: 4.3 meq/L (ref 3.5–5.1)
Sodium: 136 mEq/L (ref 136–145)
Total Bilirubin: 0.25 mg/dL (ref 0.20–1.20)
Total Protein: 7.4 g/dL (ref 6.4–8.3)

## 2014-03-26 LAB — MAGNESIUM (CC13): Magnesium: 2.1 mg/dl (ref 1.5–2.5)

## 2014-03-26 NOTE — Telephone Encounter (Signed)
Pt's wife called to let Dr Vista Mink know that pt went to Dr Melvyn Novas yesterday and an x-ray was done showing PNA.  He was given antibiotics, steroids, and breathing treatments.  Will inform Dr. Vista Mink.  SLJ

## 2014-03-27 ENCOUNTER — Telehealth: Payer: Self-pay | Admitting: Internal Medicine

## 2014-03-27 ENCOUNTER — Telehealth: Payer: Self-pay | Admitting: *Deleted

## 2014-03-27 DIAGNOSIS — J189 Pneumonia, unspecified organism: Secondary | ICD-10-CM

## 2014-03-27 NOTE — Telephone Encounter (Signed)
Pt's wife called wanting to let us know that she has scheduled a f/u with Dr Melvyn Novas on 04/01/14 in the morning, they have meet with him and have an x-ray done before seeing Dr Vista Mink later in the morning.  SLJ

## 2014-03-28 NOTE — Telephone Encounter (Signed)
Spoke with pt's wife and advised that order for cxr was placed and pt can come prior to appt on 04/01/14 to have cxr done.

## 2014-04-01 ENCOUNTER — Ambulatory Visit (INDEPENDENT_AMBULATORY_CARE_PROVIDER_SITE_OTHER)
Admission: RE | Admit: 2014-04-01 | Discharge: 2014-04-01 | Disposition: A | Discharge status: Home / Self Care | Payer: BC Managed Care – PPO | Source: Ambulatory Visit | Attending: Internal Medicine | Admitting: Internal Medicine

## 2014-04-01 ENCOUNTER — Telehealth: Payer: Self-pay | Admitting: Internal Medicine

## 2014-04-01 ENCOUNTER — Encounter: Payer: Self-pay | Admitting: Internal Medicine

## 2014-04-01 ENCOUNTER — Ambulatory Visit (INDEPENDENT_AMBULATORY_CARE_PROVIDER_SITE_OTHER): Payer: BC Managed Care – PPO | Admitting: Internal Medicine

## 2014-04-01 ENCOUNTER — Telehealth: Payer: Self-pay | Admitting: *Deleted

## 2014-04-01 ENCOUNTER — Other Ambulatory Visit (HOSPITAL_BASED_OUTPATIENT_CLINIC_OR_DEPARTMENT_OTHER): Payer: BC Managed Care – PPO

## 2014-04-01 ENCOUNTER — Ambulatory Visit (HOSPITAL_BASED_OUTPATIENT_CLINIC_OR_DEPARTMENT_OTHER): Payer: BC Managed Care – PPO | Admitting: Internal Medicine

## 2014-04-01 VITALS — BP 125/65 | HR 86 | Temp 97.8°F | Resp 19 | Ht 71.0 in | Wt 181.4 lb

## 2014-04-01 VITALS — BP 104/62 | HR 103 | Temp 98.0°F | Ht 71.0 in | Wt 182.0 lb

## 2014-04-01 DIAGNOSIS — T451X5A Adverse effect of antineoplastic and immunosuppressive drugs, initial encounter: Secondary | ICD-10-CM

## 2014-04-01 DIAGNOSIS — J189 Pneumonia, unspecified organism: Secondary | ICD-10-CM

## 2014-04-01 DIAGNOSIS — F411 Generalized anxiety disorder: Secondary | ICD-10-CM

## 2014-04-01 DIAGNOSIS — R0989 Other specified symptoms and signs involving the circulatory and respiratory systems: Secondary | ICD-10-CM

## 2014-04-01 DIAGNOSIS — R5381 Other malaise: Secondary | ICD-10-CM

## 2014-04-01 DIAGNOSIS — R0609 Other forms of dyspnea: Secondary | ICD-10-CM

## 2014-04-01 DIAGNOSIS — J449 Chronic obstructive pulmonary disease, unspecified: Secondary | ICD-10-CM

## 2014-04-01 DIAGNOSIS — J4489 Other specified chronic obstructive pulmonary disease: Secondary | ICD-10-CM

## 2014-04-01 DIAGNOSIS — R5383 Other fatigue: Secondary | ICD-10-CM

## 2014-04-01 DIAGNOSIS — C3410 Malignant neoplasm of upper lobe, unspecified bronchus or lung: Secondary | ICD-10-CM

## 2014-04-01 DIAGNOSIS — F17200 Nicotine dependence, unspecified, uncomplicated: Secondary | ICD-10-CM

## 2014-04-01 DIAGNOSIS — R059 Cough, unspecified: Secondary | ICD-10-CM

## 2014-04-01 DIAGNOSIS — D6481 Anemia due to antineoplastic chemotherapy: Secondary | ICD-10-CM

## 2014-04-01 DIAGNOSIS — R918 Other nonspecific abnormal finding of lung field: Secondary | ICD-10-CM

## 2014-04-01 DIAGNOSIS — M898X9 Other specified disorders of bone, unspecified site: Secondary | ICD-10-CM

## 2014-04-01 DIAGNOSIS — C3492 Malignant neoplasm of unspecified part of left bronchus or lung: Secondary | ICD-10-CM

## 2014-04-01 DIAGNOSIS — J9611 Chronic respiratory failure with hypoxia: Secondary | ICD-10-CM

## 2014-04-01 DIAGNOSIS — R05 Cough: Secondary | ICD-10-CM

## 2014-04-01 DIAGNOSIS — C3490 Malignant neoplasm of unspecified part of unspecified bronchus or lung: Secondary | ICD-10-CM

## 2014-04-01 LAB — CBC WITH DIFFERENTIAL/PLATELET
BASO%: 0.2 % (ref 0.0–2.0)
Basophils Absolute: 0 10*3/uL (ref 0.0–0.1)
EOS ABS: 0 10*3/uL (ref 0.0–0.5)
EOS%: 0.1 % (ref 0.0–7.0)
HEMATOCRIT: 27.8 % — AB (ref 38.4–49.9)
HGB: 9 g/dL — ABNORMAL LOW (ref 13.0–17.1)
LYMPH%: 10.7 % — AB (ref 14.0–49.0)
MCH: 31.5 pg (ref 27.2–33.4)
MCHC: 32.4 g/dL (ref 32.0–36.0)
MCV: 97.3 fL (ref 79.3–98.0)
MONO#: 1.4 10*3/uL — AB (ref 0.1–0.9)
MONO%: 9.8 % (ref 0.0–14.0)
NEUT%: 79.2 % — AB (ref 39.0–75.0)
NEUTROS ABS: 11.2 10*3/uL — AB (ref 1.5–6.5)
Platelets: 213 10*3/uL (ref 140–400)
RBC: 2.86 10*6/uL — ABNORMAL LOW (ref 4.20–5.82)
RDW: 15.1 % — ABNORMAL HIGH (ref 11.0–14.6)
WBC: 14.1 10*3/uL — AB (ref 4.0–10.3)
lymph#: 1.5 10*3/uL (ref 0.9–3.3)

## 2014-04-01 LAB — COMPREHENSIVE METABOLIC PANEL (CC13)
ALT: <6 U/L (ref 0–55)
AST: 8 U/L (ref 5–34)
Albumin: 3.3 g/dL — ABNORMAL LOW (ref 3.5–5.0)
Alkaline Phosphatase: 85 U/L (ref 40–150)
Anion Gap: 9 mEq/L (ref 3–11)
BUN: 37.1 mg/dL — ABNORMAL HIGH (ref 7.0–26.0)
CALCIUM: 9.5 mg/dL (ref 8.4–10.4)
CO2: 39 meq/L — AB (ref 22–29)
CREATININE: 1.8 mg/dL — AB (ref 0.7–1.3)
Chloride: 92 mEq/L — ABNORMAL LOW (ref 98–109)
Glucose: 270 mg/dl — ABNORMAL HIGH (ref 70–140)
Potassium: 4.7 mEq/L (ref 3.5–5.1)
Sodium: 140 mEq/L (ref 136–145)
Total Protein: 6.4 g/dL (ref 6.4–8.3)

## 2014-04-01 LAB — MAGNESIUM (CC13): MAGNESIUM: 2 mg/dL (ref 1.5–2.5)

## 2014-04-01 MED ORDER — OXYCODONE-ACETAMINOPHEN 5-325 MG PO TABS: 1.0000 | ORAL_TABLET | ORAL | Status: AC | PRN

## 2014-04-01 MED ORDER — HYDROCODONE-HOMATROPINE 5-1.5 MG/5ML PO SYRP: 5.0000 mL | ORAL_SOLUTION | Freq: Four times a day (QID) | ORAL | Status: AC | PRN

## 2014-04-01 NOTE — Telephone Encounter (Signed)
gv adn pritned appt sched and avs for pt for Aug adn Sept...sed and MW added tx.

## 2014-04-01 NOTE — Telephone Encounter (Signed)
Per staff message and POF I have scheduled appts. Advised scheduler of appts. JMW  

## 2014-04-01 NOTE — Progress Notes (Signed)
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 Status post 3 cycles.   DISEASE STAGE:  Small cell carcinoma of left lung - extensive stage  Primary site: Lung (Left)  Staging method: AJCC 7th Edition  Clinical: Stage IV (T2b, N3, M1b) signed by Curt Bears, MD on 01/24/2014 5:08 PM  Summary: Stage IV (T2b, N3, M1b)  CHEMOTHERAPY INTENT: control  CURRENT # OF CHEMOTHERAPY CYCLES: 3 CURRENT ANTIEMETICS: Dexamethasone, Aloxi, Emend, Compazine  CURRENT SMOKING STATUS: Currently occasionally smokes  ORAL CHEMOTHERAPY AND CONSENT: n/a  CURRENT BISPHOSPHONATES USE: none  PAIN MANAGEMENT: none  NARCOTICS INDUCED CONSTIPATION:  LIVING WILL AND CODE STATUS:    INTERVAL HISTORY: Alex Hart 58 y.o. male returns to the clinic today for followup visit accompanied by his wife. The patient has been complaining of increasing fatigue and weakness as well as shortness of breath at baseline and increased with exertion and he is currently on home oxygen. He was recently seen by Dr. Melvyn Novas and treated for questionable pneumonia. He completed the course of antibiotic but still not feeling well. He was supposed to start cycle #4 of his chemotherapy today. He was seen earlier today by Dr. Melvyn Novas. He denied having any significant nausea or vomiting, no fever or chills. He has no significant weight loss or night sweats.   MEDICAL HISTORY: Past  Medical History  Diagnosis Date  . Muscle spasm of back   . Blood in stool   . Anxiety   . Arthritis   . Chronic pain syndrome   . Hx of tonsillectomy   . Bronchitis, chronic   . Hypertension   . Hyperlipidemia   . GERD (gastroesophageal reflux disease)   . COPD (chronic obstructive pulmonary disease)   . Small cell carcinoma of left lung 01/24/2014    ALLERGIES:  is allergic to chantix; azor; codeine; crestor; lipitor; lovastatin; and pravachol.  MEDICATIONS:  Current Outpatient Prescriptions  Medication Sig Dispense Refill  . ALPRAZolam (XANAX) 1 MG tablet Take 1 tablet (1 mg total) by mouth 3 (three) times daily as needed for anxiety.  90 tablet  2  . HYDROcodone-homatropine (HYCODAN) 5-1.5 MG/5ML syrup Take 5 mLs by mouth every 6 (six) hours as needed for cough.  240 mL  0  . magnesium oxide (MAG-OX) 400 (241.3 MG) MG tablet Take 1 tablet (400 mg total) by mouth 4 (four) times daily.  120 tablet  0  . oxyCODONE-acetaminophen (PERCOCET/ROXICET) 5-325 MG per tablet Take 1 tablet by mouth every 4 (four) hours as needed for severe pain.  30 tablet  0  . pantoprazole (PROTONIX) 40 MG tablet Take 1 tablet (40 mg total) by mouth daily.  90 tablet  3  . predniSONE (DELTASONE) 10 MG tablet Take      2 each am until better, then  1 each am thereafter  60 tablet  0  .  PROAIR HFA 108 (90 BASE) MCG/ACT inhaler INHALE 2 PUFFS INTO THE LUNGS EVERY 6 (SIX) HOURS AS NEEDED FOR WHEEZING.  25.5 each  0  . Respiratory Therapy Supplies (FLUTTER) DEVI Use as direceted  1 each  0  . senna (SENOKOT) 8.6 MG tablet Take 1 tablet by mouth daily.      . Tiotropium Bromide-Olodaterol (STIOLTO RESPIMAT) 2.5-2.5 MCG/ACT AERS Inhale 2 puffs into the lungs every morning.  1 Inhaler  0  . prochlorperazine (COMPAZINE) 10 MG tablet Take 1 tablet (10 mg total) by mouth every 6 (six) hours as needed for nausea or vomiting.  60 tablet  0   No current facility-administered medications for this visit.    SURGICAL  HISTORY:  Past Surgical History  Procedure Laterality Date  . Tonsillectomy    . Inguinal hernia repair  1990  . Back surgery  1992  . Abdominal surgery to remove adhesions from colon      teenager  . Video bronchoscopy Bilateral 12/19/2013    Procedure: VIDEO BRONCHOSCOPY WITHOUT FLUORO;  Surgeon: Tanda Rockers, MD;  Location: [facility];  Service: Cardiopulmonary;  Laterality: Bilateral;    REVIEW OF SYSTEMS:  Constitutional: positive for fatigue Eyes: negative Ears, nose, mouth, throat, and face: negative Respiratory: positive for cough and dyspnea on exertion Cardiovascular: negative Gastrointestinal: negative Genitourinary:negative Integument/breast: negative Hematologic/lymphatic: negative Musculoskeletal:negative Neurological: negative Behavioral/Psych: negative Endocrine: negative Allergic/Immunologic: negative   PHYSICAL EXAMINATION: General appearance: alert, cooperative, fatigued and no distress Head: Normocephalic, without obvious abnormality, atraumatic Neck: no adenopathy, no JVD, supple, symmetrical, trachea midline and thyroid not enlarged, symmetric, no tenderness/mass/nodules Lymph nodes: Cervical, supraclavicular, and axillary nodes normal. Resp: clear to auscultation bilaterally Back: symmetric, no curvature. ROM normal. No CVA tenderness. Cardio: regular rate and rhythm, S1, S2 normal, no murmur, click, rub or gallop GI: soft, non-tender; bowel sounds normal; no masses,  no organomegaly Extremities: extremities normal, atraumatic, no cyanosis or edema Neurologic: Alert and oriented X 3, normal strength and tone. Normal symmetric reflexes. Normal coordination and gait  ECOG PERFORMANCE STATUS: 1 - Symptomatic but completely ambulatory  Blood pressure 125/65, pulse 86, temperature 97.8 F (36.6 C), temperature source Oral, resp. rate 19, height 5\' 11"  (1.803 m), weight 181 lb 6.4 oz (82.283 kg), SpO2 96.00%.  LABORATORY DATA: Lab Results  Component  Value Date   WBC 14.1* 04/01/2014   HGB 9.0* 04/01/2014   HCT 27.8* 04/01/2014   MCV 97.3 04/01/2014   PLT 213 04/01/2014      Chemistry      Component Value Date/Time   NA 136 03/26/2014 1610   NA 140 11/12/2013 2015   K 4.3 03/26/2014 1610   K 4.3 11/12/2013 2015   CL 97 11/12/2013 2015   CO2 37* 03/26/2014 1610   CO2 28 11/12/2013 2015   BUN 40.7* 03/26/2014 1610   BUN 18 11/12/2013 2015   CREATININE 2.0* 03/26/2014 1610   CREATININE 0.86 11/12/2013 2015      Component Value Date/Time   CALCIUM 10.3 03/26/2014 1610   CALCIUM 8.9 11/12/2013 2015   ALKPHOS 107 03/26/2014 1610   ALKPHOS 76 11/12/2013 2015   AST 8 03/26/2014 1610   AST 13 11/12/2013 2015   ALT <6 03/26/2014 1610   ALT 10 11/12/2013 2015   BILITOT 0.25 03/26/2014 1610   BILITOT 0.5 11/12/2013 2015       RADIOGRAPHIC STUDIES:  ASSESSMENT AND PLAN:   1) Extensive stage small cell lung cancer: This is a 58 years old white  male recently diagnosed with extensive stage small cell lung cancer currently undergoing systemic chemotherapy with cisplatin and etoposide status post 3 cycles. He has evidence for improvement of his disease after cycle #2 . The patient is not feeling well today and complaining of increasing fatigue and weakness after the recent diagnosis of pneumonia. I recommended for him to delay the start of cycle #4 by 1 week to start next week.  2) chemotherapy-induced anemia: His hemoglobin is still low but much improved after the PRBCs transfusion. Will continue to monitor for now.  3) right lower lobe lung pneumonia: Completed a course of treatment with Levaquin under the care of Dr. Melvyn Novas.  4) COPD: Currently on treatment with prednisone 10 mg by mouth daily and PoAir.  5) pain management: Continue Percocet  6) Anxiety: Currently on Xanax 1 mg by mouth 3 times a day as needed.  7) hypomagnesemia: Continue magnesium oxide 4 times a day.  The patient would come back for followup visit in one week for reevaluation before  starting cycle #4.  He was advised to call immediately if he has any concerning symptoms in the interval. The patient voices understanding of current disease status and treatment options and is in agreement with the current care plan.  All questions were answered. The patient knows to call the clinic with any problems, questions or concerns. We can certainly see the patient much sooner if necessary.  Disclaimer: This note was dictated with voice recognition software. Similar sounding words can inadvertently be transcribed and may not be corrected upon review.

## 2014-04-01 NOTE — Patient Instructions (Addendum)
For cough use the flutter valve  and the cough syrup more aggressively   As you improve you should use a lot less albuerol and be able to reduce the prednisone to 10 mg daily    Add pepcid 20 mg at bedtime and protonix should be 30 min before your first meal of the day   Please schedule a follow up office visit in 2 weeks, sooner if needed

## 2014-04-01 NOTE — Progress Notes (Signed)
Subjective:  Patient ID: Alex Hart, male    DOB: 1956-08-09   MRN: [id number]   Brief patient profile:  29 yowm quit smoking 11/2013 with doe x 2009 worse 2013 referred 10/30/2012 to pulmonary clinic by Dr Alain Marion with GOLD III COPD documented 12/2012   History of Present Illness  10/30/2012 1st pulmonary eval still smoking  With doe x pushing a mower, rushing when walking dog, otherwise has just learned to pace himself. rec Plan A = automatic = symbicort 160 Take 2 puffs first thing in am and then another 2 puffs about 12 hours later.  Only use your albuterol as a rescue medication   12/22/2012 f/u ov/Wert re GOLD III COPD/ still smoking Chief Complaint  Patient presents with  . Follow-up    Pt states breathing is unchanged since the last visit and denies any new co's.   If you stop smokiing now, your lung function should level off where it is - this is the most important aspect of your care Continue symbicort 160 Take 2 puffs first thing in am and then another 2 puffs about 12 hours later.  Only use your combivent as a rescue medication    06/27/2013 f/u ov/Wert re: COPD GOLD III/ still smoking  Chief Complaint  Patient presents with  . Follow-up    Pt c/o SOB with any exertion, postnasal drainage, sometimes prod cough with yellow mucous X38m.  Pt states he is overusing rescue inhaler, believes it is is due to him panicking.  For months using more than twice daily combivent  Stop combivent While on prednisone double your fluid/blood pressure pill to offset fluid retention Change the protonix to 40 mg Take 30- 60 min before your first and last meals of the day until breathing/ coughing better then just one before bfast daily  Augmentin 875 mg take one pill twice daily  X 10 days - take at breakfast and supper with large glass of water.  It would help reduce the usual side effects (diarrhea and yeast infections) if you ate cultured yogurt at lunch.  Prednisone 10 mg take  4 each am  x 2 days,   2 each am x 2 days,  1 each am x 2 days and stop Plan A = automatic= symbicort 160 Take 2 puffs first thing in am and then another 2 puffs about 12 hours later.  And tudorza one twice daily Plan B= Backup= proair (albuterol) ok up to 2 puffs every 4 hours but if you need it more than twice daily you need to return here  Plan C = nebulizer (hold off for now and work on maintaining off cigarettes) Plan D = Doctor, call if needed Plan E = ER, if all else fails Monitor your blood pressure carefully at home and call Dr Alain Marion if over 140/85   11/15/2013 Post ER l follow up  Was seen in ER on 4/6/ for cough , congestion and fever-102 x 5 days. In ER cxr showed 4.4 cm masslike density in left hilum. Subsequent CT showed 4x 8.7 cm left nodal conglomeration and 2.6 cm lul nodule. W/ patchy tree in bud infitrate throughout lungs. Bulky medistinal lymphadenopathy.  He was started on Levaquin  And prednisone  He is feeling some better but still weak  rec Finish Levaquin and Prednisone as directed.  Mucinex DM Twice daily  As needed  Cough/congestion  Fluids and rest    11/22/2013 post ER f/u ov/Wert re: GOLD III copd/ sp ER eval  for fever rx levaquin x 10 d, pred pak Chief Complaint  Patient presents with  . Follow-up    Pt reports his breathing has improved, but not back to baseline. His cough is unchanged. No new co's today. He is using rescue inhaler at least once per day.  fever resolved, no hemoptysis / quit smoking since ER eval  rec If condition worsens >  Augmentin 875 mg take one pill twice daily  X 10 days - take at breakfast and supper with large glass of water.  It would help reduce the usual side effects (diarrhea and yeast infections) if you ate cultured yogurt at lunch.  No work until seen in follow up (last worked 11/11/13)  For cough mucinex dm 1200 mg every 12 hours as needed  Please schedule a follow up office visit in 2  weeks, sooner if needed with cxr    12/07/2013  f/u ov/Wert re: pna vs ca LUL/qualifying for 24 h 02 / no longer smoking / never took augmentin Chief Complaint  Patient presents with  . Follow-up    Pt states that his SOB is about the same. Cough is still prod, but started to produce yellow to green sputum about 1 wk ago. Using rescue inhaler approx 4 times per day.   rec Go ahead and take the augmentin  Call me in one week to discuss possibility of bronchoscopy Please see patient coordinator before you leave today  to schedule 24 h 02  FOB 12/19/13 >  L true vocal cord paralysis, endobronchial dz LUL > crush aritifact suggestive/ not dx of small cell - PET 9/67/89 > Hypermetabolic masses in the central left upper lobe and medial left lung apex, suspicious for synchronous primary bronchogenic Carcinomas > CT guided bx rec > small cell    01/07/2014 f/u ov/Wert re: copd/ lung mass / last chemo  Chief Complaint  Patient presents with  . Follow-up    Pt states his breathing is unchanged since last visit. Voice is becoming more hoarse and he is having some difficulty with swallowing.   No hemoptysis/ no purulent sputum.  Very sedentary but no sob on 02  rec Add spiriva         02/18/2014 f/u ov/Wert re:  02 3lpm 24/7  dulera 100/ spiriva and comibent rarely needed  Chief Complaint  Patient presents with  . Follow-up    Pt states that his breathing is unchanged since his last visit. No new co's today.   Now on 02 2lpm can walk slow up an aisle at [employer] but that's about it.  rec No change in pulmonary medications or 02 Pneumovax today    03/25/2014 f/u ov/Wert re: copd/ quit smoking x 5 days Chief Complaint  Patient presents with  . Acute Visit    o2 sat 89% on 2lpm upon arrival to exam room via wheelchair.  c/o increased SOB x 1 wk, wheezing, and chest tightness.    mostly 3lpm, occ need Sleeps R side down Cough with white phlegm only, no fever.  rc  Start stiolto 2 puffs each am  Stop combivent, the spiriva and  dulera Add Pepcid ac 20 mg at bedtime (over the counter For cough use mucinex or mucinex dm up to 1200 mg every 12 hours and use flutter valve as much as you can  Depomedrol 120 mg today  Only use your albuterol (proair) as a rescue medication Prednsionse 10 x 2 each am until feel back to normal, then  reduce to 1 each am Add Levaquin 750 x5 days for ? RLL pna     04/01/2014 f/u ov/Wert re: chronic resp failure 3lpm 24/7 and pred 20  On saba 6 x daily  Chief Complaint  Patient presents with  . Followup with CXR    Breathing is unchanged since the last visit, "maybe a tad better, but I doubt it".  Cough is no better, not able to produce any sputum.  He c/o continuous PND.   not really using flutter or cough medications, using saba up to 10 x daily for cough    No obvious day to day or daytime variabilty  Cp  chest tightness, subjective wheeze overt sinus or hb symptoms. No unusual exp hx or h/o childhood pna/ asthma or knowledge of premature birth.  Sleeping ok without nocturnal  or early am exacerbation  of respiratory  c/o's or need for noct saba. Also denies any obvious fluctuation of symptoms with weather or environmental changes or other aggravating or alleviating factors except as outlined above   Current Medications, Allergies, Complete Past Medical History, Past Surgical History, Family History, and Social History were reviewed in [employer] record.  ROS  The following are not active complaints unless bolded sore throat, dysphagia, dental problems, itching, sneezing,  nasal congestion or excess/ purulent secretions, ear ache,   fever, chills, sweats, unintended wt loss, pleuritic or exertional cp, hemoptysis,  orthopnea pnd or leg swelling, presyncope, palpitations, heartburn, abdominal pain, anorexia, nausea, vomiting, diarrhea  or change in bowel or urinary habits, change in stools or urine, dysuria,hematuria,  rash, arthralgias, visual complaints, headache,  numbness weakness or ataxia or problems with walking or coordination,  change in mood/affect or memory.                 Objective:   Physical Exam  amb wm nad     06/27/2013     230  > 08/27/2013  216  >210 11/15/2013 > 11/22/2013  206 > 12/08/2013 206 > 01/09/2014  205 >  02/18/14 199 >   04/01/2014 182   HEENT mild turbinate edema.  Oropharynx no thrush or excess pnd or cobblestoning.  No JVD or cervical adenopathy. Mild accessory muscle hypertrophy. Trachea midline, nl thryroid. Chest was hyperinflated by percussion with a bilateral exp rhonchi Regular rate and rhythm without murmur gallop or rub or increase P2 or edema.  Abd: no hsm, nl excursion. Ext warm without cyanosis - POS mild clubbing       CXR  04/01/2014 :  COPD/chronic bronchitis, with improvement or resolution in right mid  lung airspace disease.      Assessment & Plan:

## 2014-04-01 NOTE — Assessment & Plan Note (Signed)
Encouraged to continue whatever rx dr Earlie Server deems appropriate

## 2014-04-01 NOTE — Assessment & Plan Note (Signed)
02 % 86% RA 12/07/2013 > 24 h 02 2lpm  Adequate control on present rx, reviewed > no change in rx needed

## 2014-04-01 NOTE — Assessment & Plan Note (Signed)
-   s/p smoking cessation 03/20/14  - PFT's 12/07/12 FEV1  1.12 (33%) ratio 49 and 17% improved p B2, DLCO 65 improved to 92% p correction - 03/25/2014 p extensive coaching HFA effectiveness =    90% > try stiolto  - maint pred rx 03/25/2014 >>>   Improved but over using saba for cough.    See instructions for specific recommendations which were reviewed directly with the patient who was given a copy with highlighter outlining the key components.

## 2014-04-02 ENCOUNTER — Encounter: Payer: BC Managed Care – PPO | Admitting: Nutrition

## 2014-04-02 ENCOUNTER — Ambulatory Visit: Payer: BC Managed Care – PPO

## 2014-04-02 ENCOUNTER — Telehealth: Payer: Self-pay | Admitting: Internal Medicine

## 2014-04-02 NOTE — Telephone Encounter (Signed)
Looking at pt's ov note from 03/25/14, it looks like Spiriva was d/c'ed from pt's med list and was not added back on at visit on 8/24.  Called pt to verify that he is not taking his spiriva.  He is not.  Called pharmacy to make them aware.  Nothing further needed.

## 2014-04-03 ENCOUNTER — Ambulatory Visit: Payer: BC Managed Care – PPO

## 2014-04-04 ENCOUNTER — Telehealth: Payer: Self-pay | Admitting: Medical Oncology

## 2014-04-04 ENCOUNTER — Ambulatory Visit: Payer: BC Managed Care – PPO

## 2014-04-04 NOTE — Telephone Encounter (Signed)
Per Alex Hart , his dentist needs to decide about an antibiotic. Pt notified. I called Dr Tanna Furry office and left message regarding this .

## 2014-04-04 NOTE — Telephone Encounter (Signed)
Part of his Right rear bottom tooth. Does he need to be on antibiotic? Last chemo with neulasta on 8/6.

## 2014-04-05 ENCOUNTER — Ambulatory Visit: Payer: BC Managed Care – PPO

## 2014-04-08 ENCOUNTER — Other Ambulatory Visit: Payer: BC Managed Care – PPO

## 2014-04-08 ENCOUNTER — Other Ambulatory Visit: Payer: Self-pay | Admitting: *Deleted

## 2014-04-08 ENCOUNTER — Ambulatory Visit: Payer: BC Managed Care – PPO | Admitting: Physician Assistant

## 2014-04-08 ENCOUNTER — Ambulatory Visit: Payer: BC Managed Care – PPO | Admitting: Internal Medicine

## 2014-04-09 ENCOUNTER — Ambulatory Visit: Payer: BC Managed Care – PPO

## 2014-04-09 ENCOUNTER — Encounter: Payer: BC Managed Care – PPO | Admitting: Nutrition

## 2014-04-09 DEATH — deceased

## 2014-04-10 ENCOUNTER — Ambulatory Visit: Payer: BC Managed Care – PPO

## 2014-04-11 ENCOUNTER — Ambulatory Visit: Payer: BC Managed Care – PPO

## 2014-04-12 ENCOUNTER — Ambulatory Visit: Payer: BC Managed Care – PPO

## 2014-04-16 ENCOUNTER — Other Ambulatory Visit: Payer: BC Managed Care – PPO

## 2014-04-16 ENCOUNTER — Ambulatory Visit: Payer: BC Managed Care – PPO | Admitting: Internal Medicine

## 2014-04-22 ENCOUNTER — Other Ambulatory Visit: Payer: BC Managed Care – PPO

## 2014-04-29 ENCOUNTER — Other Ambulatory Visit: Payer: BC Managed Care – PPO

## 2014-05-06 ENCOUNTER — Other Ambulatory Visit: Payer: BC Managed Care – PPO

## 2014-07-18 ENCOUNTER — Encounter (HOSPITAL_COMMUNITY): Payer: Self-pay | Admitting: Cardiology

## 2015-08-06 IMAGING — CR DG CHEST 2V
3 series · 3 of 3 positions shown · non-contrast
Comparison: 12/07/2013

CLINICAL DATA: Chest pain, cough, shortness of Breath

EXAM:
CHEST  2 VIEW

[view not recorded (1 of 3)]
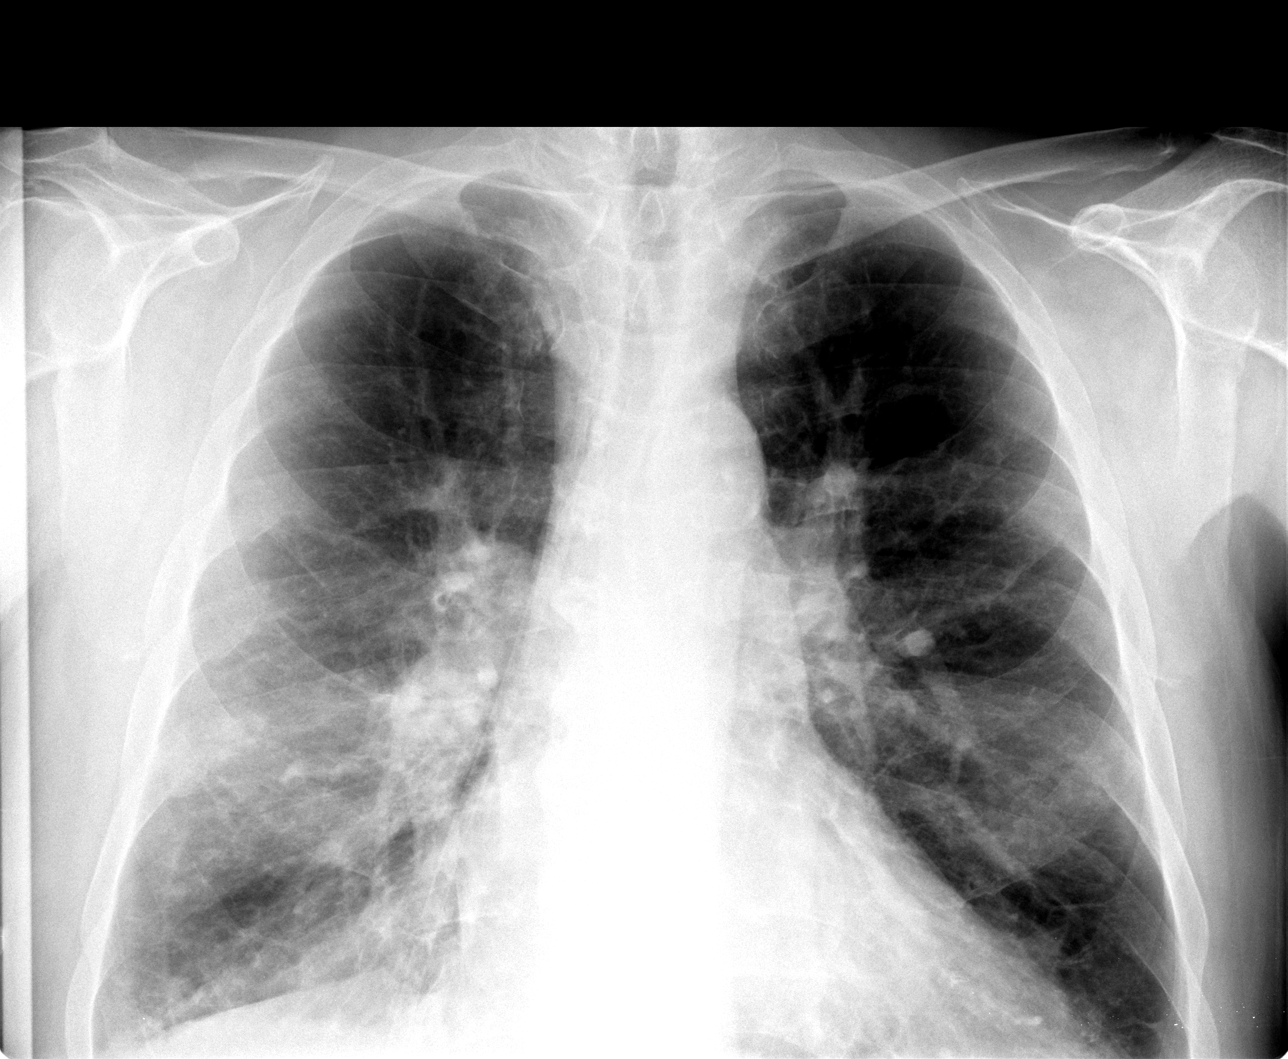

[view not recorded (2 of 3)]
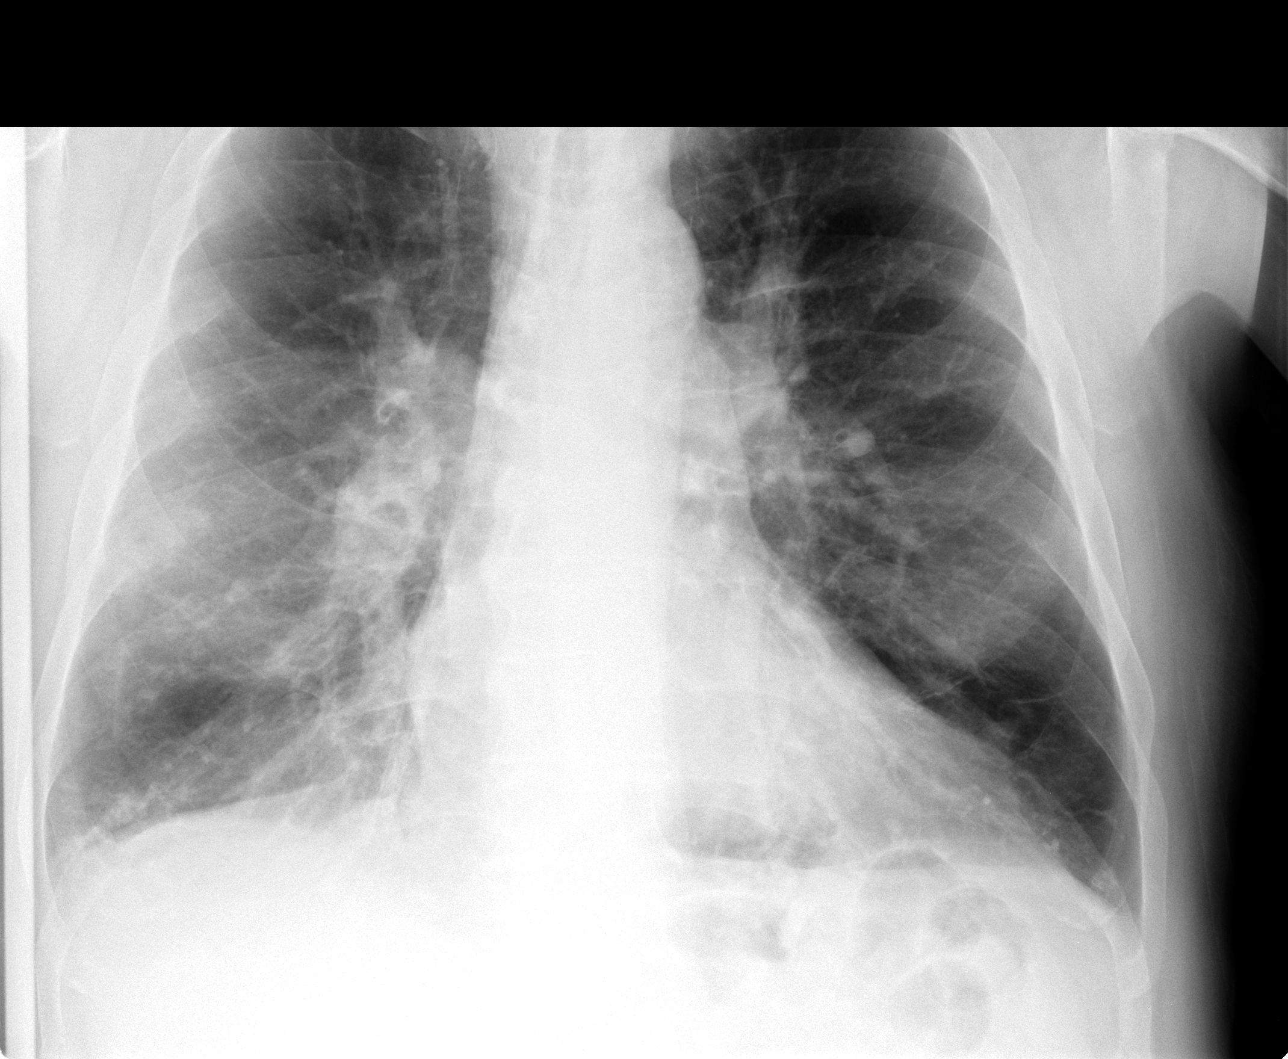

[view not recorded (3 of 3)]
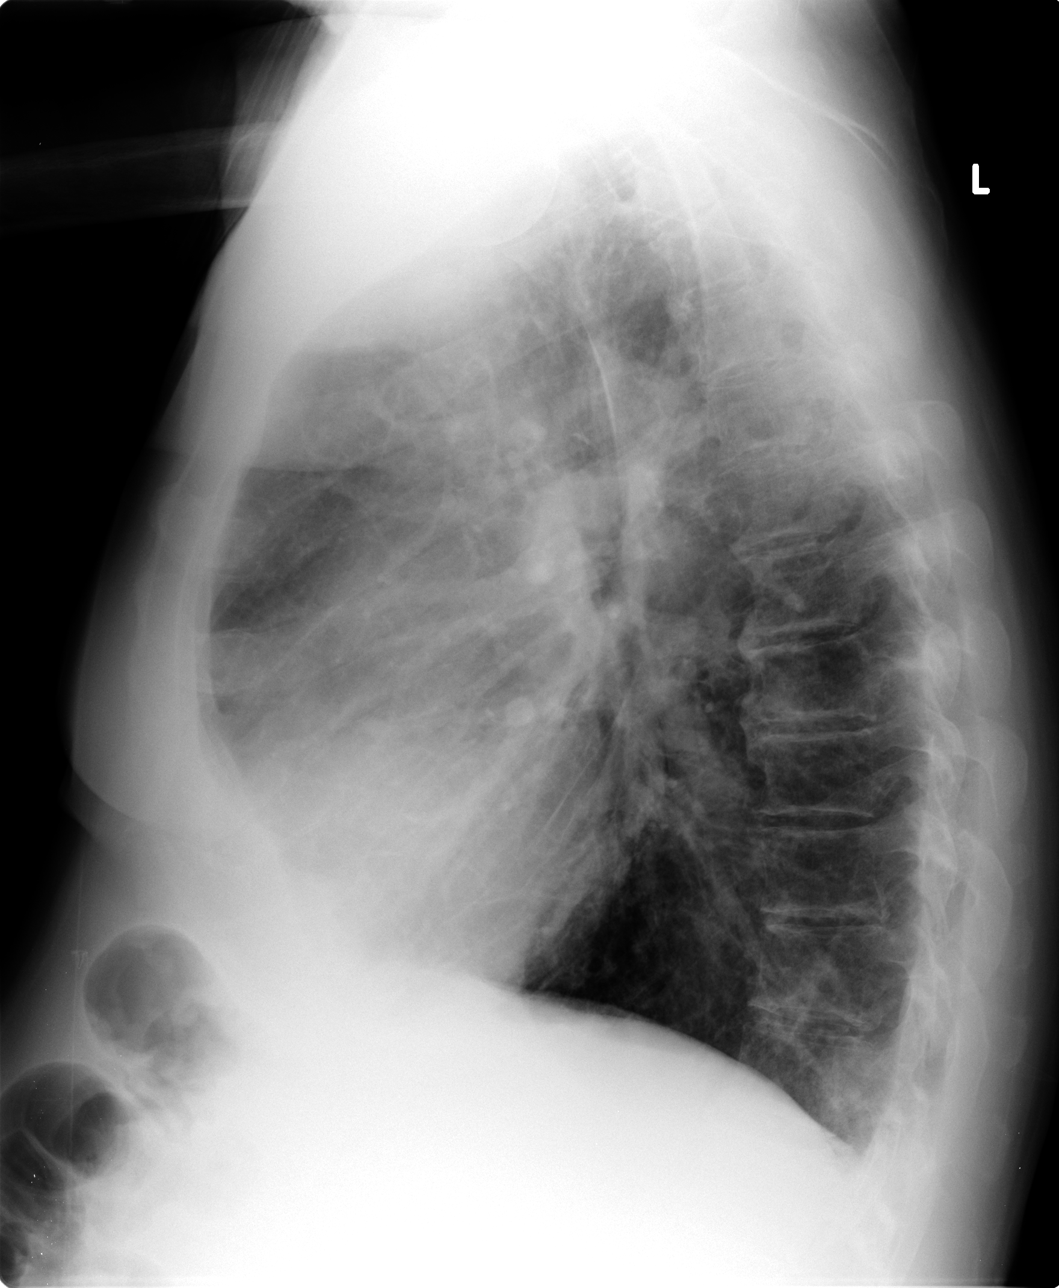

[3 of 3 positions shown; findings below may reference images not displayed]

FINDINGS: There is hyperinflation of the lungs compatible with COPD. Left
apical density again noted, and appears decreased in size.
Decreasing mediastinal and left hilar adenopathy. Heart is normal
size. Patchy right lower lobe opacities are new since prior study
and could reflect pneumonia. No effusions. No acute bony
abnormality.
IMPRESSION: Improving left apical mass, mediastinal and left hilar adenopathy.

COPD.

Patchy opacities in the right lower lung, question early pneumonia.

## 2015-08-13 IMAGING — CR DG CHEST 2V
2 series · 2 of 2 positions shown · non-contrast
Comparison: 03/25/2014

CLINICAL DATA: Followup of pneumonia. Persistent congestion.
History of bronchitis. COPD and left-sided lung cancer.

EXAM:
CHEST  2 VIEW

[view not recorded (1 of 2)]
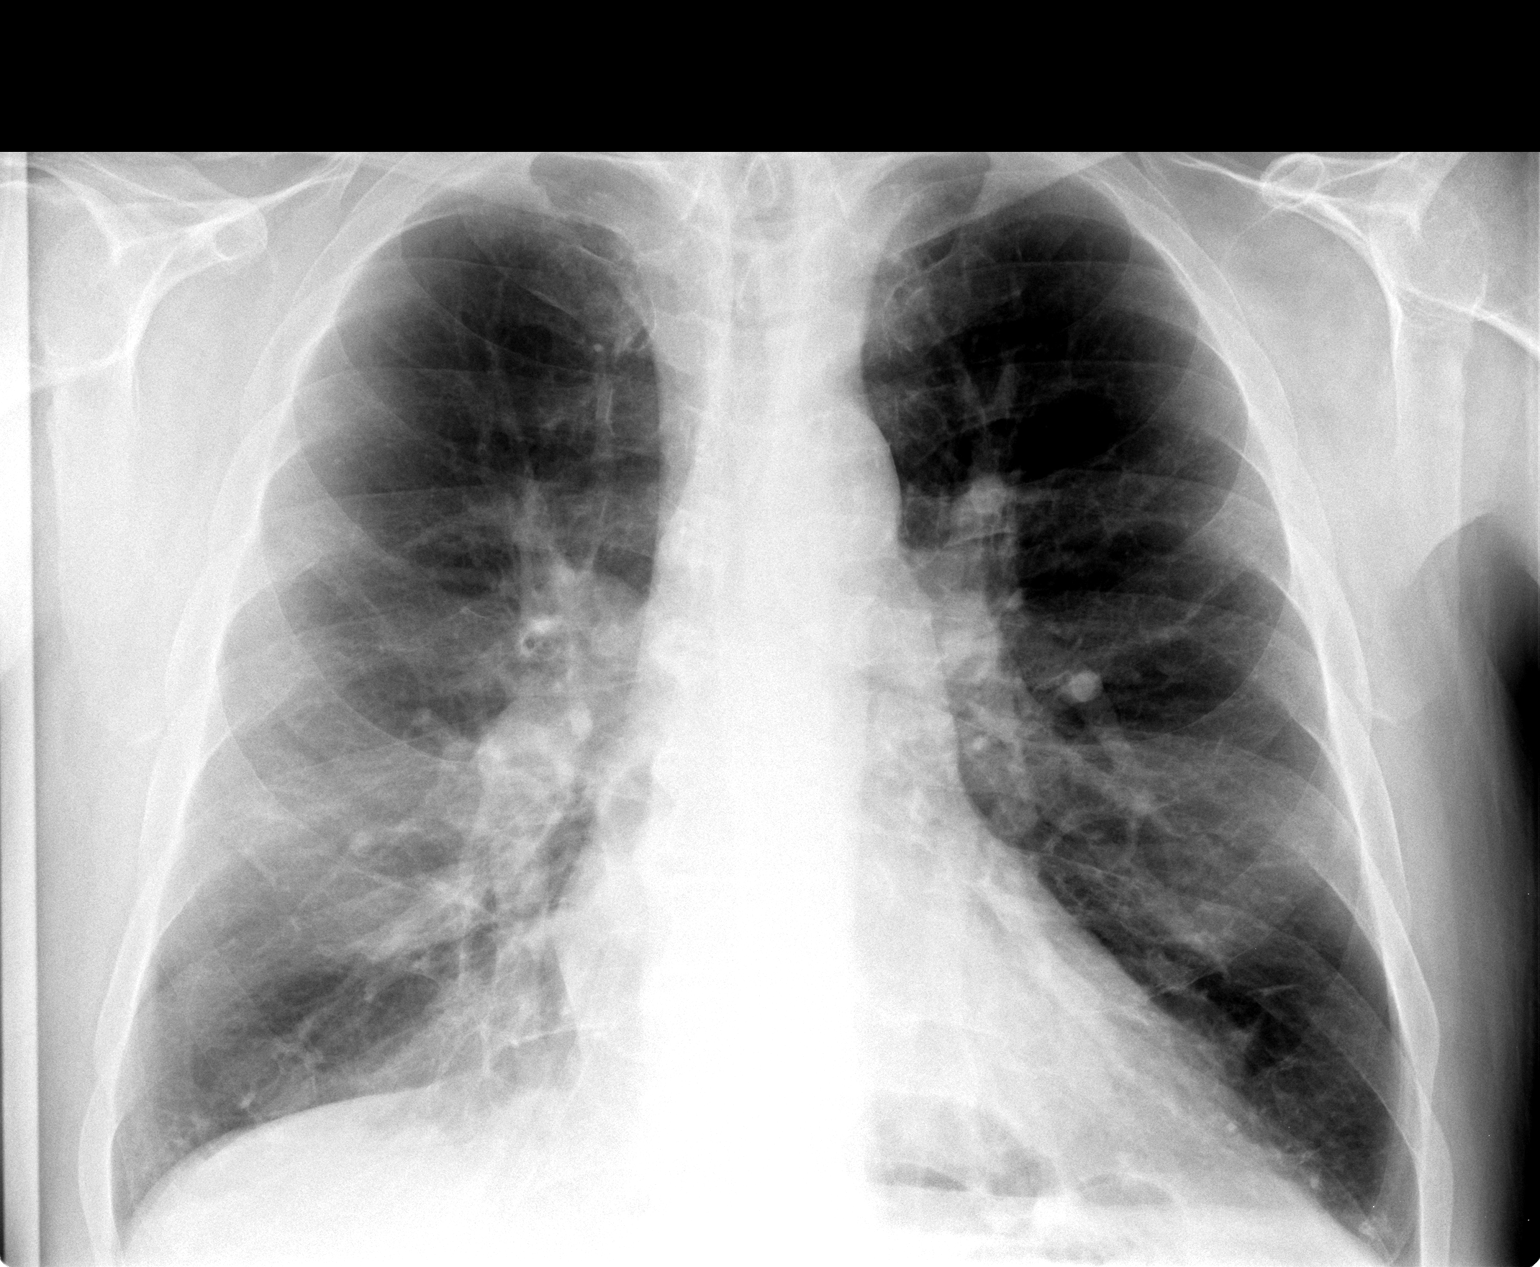

[view not recorded (2 of 2)]
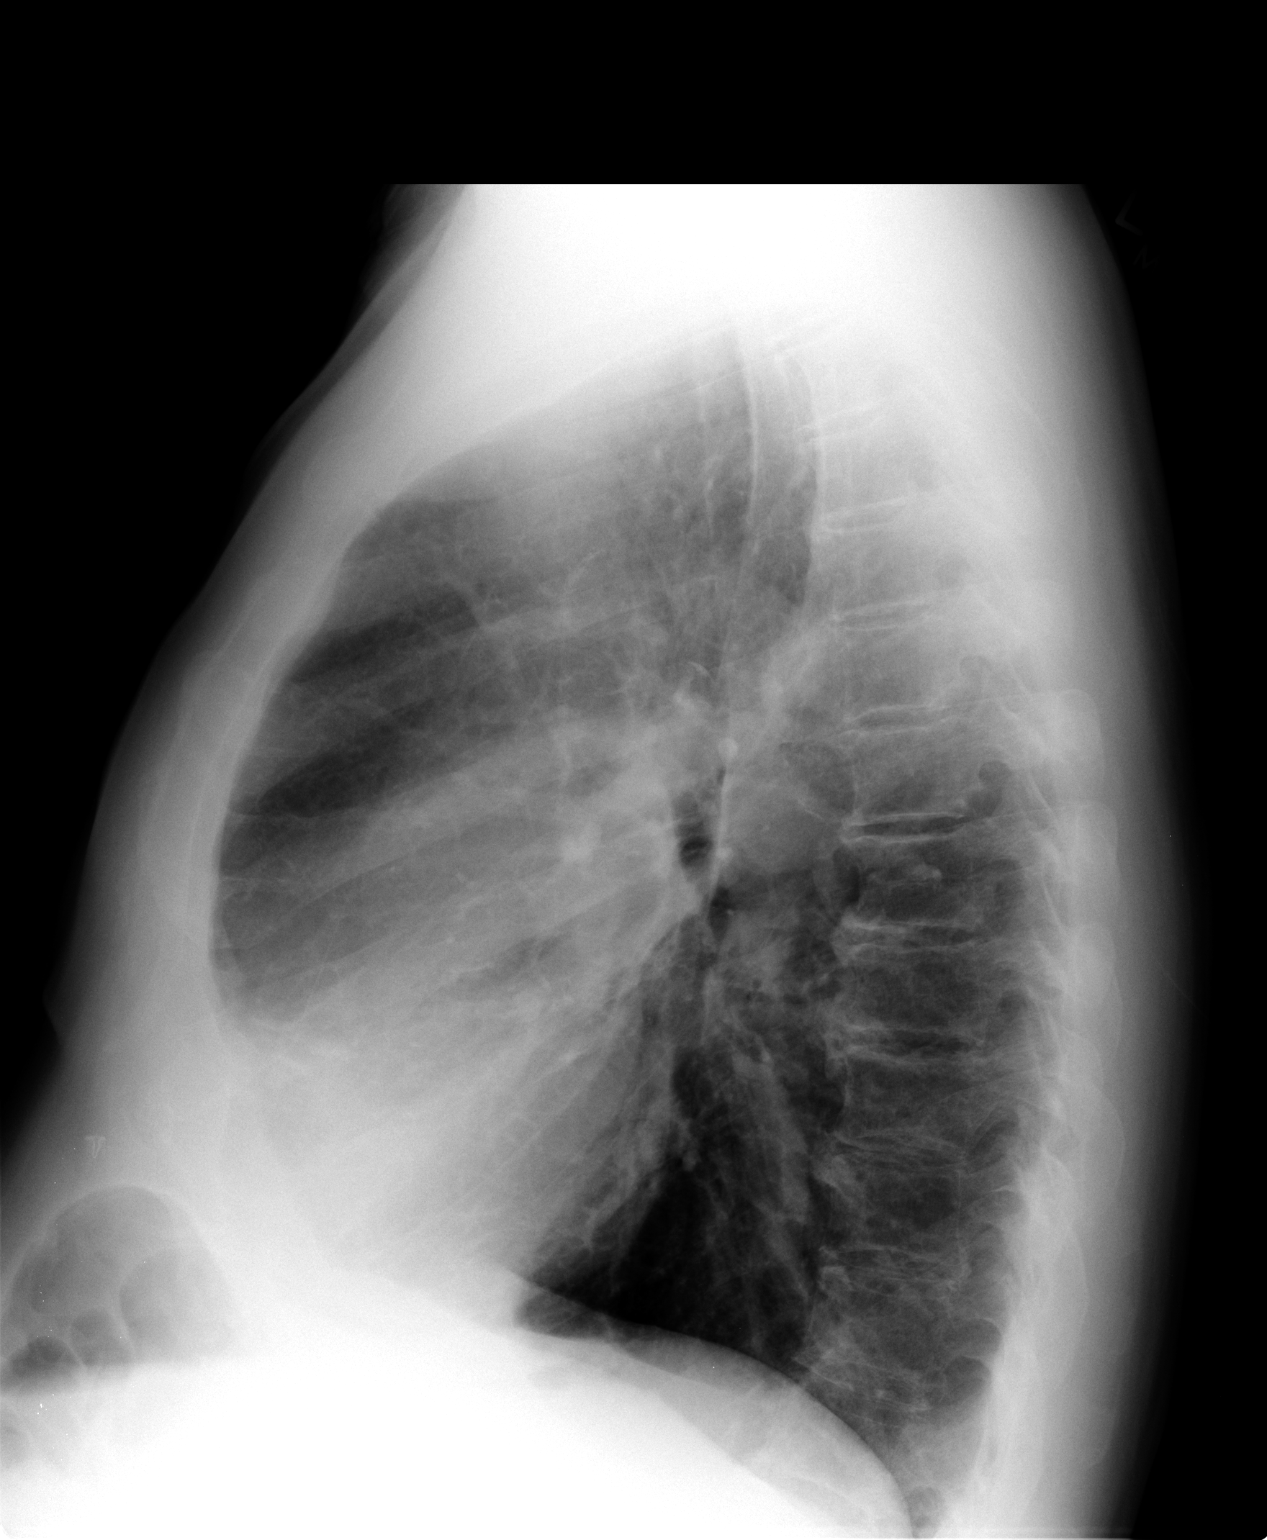

[2 of 2 positions shown; findings below may reference images not displayed]

FINDINGS: Hyperinflation/ COPD. Midline trachea. Mild cardiomegaly. Stable
mediastinal contours. No pleural effusion or pneumothorax. Chronic
interstitial thickening, with improvement to resolution in right mid
lung airspace disease. There may be minimal residual opacity in this
area. Clear left lung; left apical lung nodule is not readily
apparent by plain film.
IMPRESSION: COPD/chronic bronchitis, with improvement or resolution in right mid
lung airspace disease.

## 2016-01-27 ENCOUNTER — Other Ambulatory Visit: Payer: Self-pay | Admitting: Nurse Practitioner

## 2024-03-14 NOTE — Telephone Encounter (Signed)
 SABRA
